# Patient Record
Sex: Female | Born: 1980 | Race: Black or African American | Hispanic: No | Smoking: Never smoker
Health system: Southern US, Community
[De-identification: ages and names within clinical notes are randomized; demographics above are authoritative.]

## PROBLEM LIST (undated history)

## (undated) DIAGNOSIS — R739 Hyperglycemia, unspecified: Secondary | ICD-10-CM

## (undated) DIAGNOSIS — R7303 Prediabetes: Secondary | ICD-10-CM

## (undated) DIAGNOSIS — Z8619 Personal history of other infectious and parasitic diseases: Secondary | ICD-10-CM

## (undated) DIAGNOSIS — K625 Hemorrhage of anus and rectum: Secondary | ICD-10-CM

## (undated) DIAGNOSIS — N393 Stress incontinence (female) (male): Secondary | ICD-10-CM

## (undated) DIAGNOSIS — K219 Gastro-esophageal reflux disease without esophagitis: Secondary | ICD-10-CM

## (undated) DIAGNOSIS — E041 Nontoxic single thyroid nodule: Secondary | ICD-10-CM

## (undated) DIAGNOSIS — R87619 Unspecified abnormal cytological findings in specimens from cervix uteri: Secondary | ICD-10-CM

## (undated) HISTORY — DX: Hyperglycemia, unspecified: R73.9

## (undated) HISTORY — PX: COLPOSCOPY: SHX161

## (undated) HISTORY — DX: Personal history of other infectious and parasitic diseases: Z86.19

## (undated) HISTORY — DX: Stress incontinence (female) (male): N39.3

## (undated) HISTORY — PX: UMBILICAL HERNIA REPAIR: SHX196

## (undated) HISTORY — PX: APPENDECTOMY: SHX54

## (undated) HISTORY — PX: BREAST IMPLANT REMOVAL: SUR1101

## (undated) HISTORY — PX: BREAST ENHANCEMENT SURGERY: SHX7

## (undated) HISTORY — DX: Hemorrhage of anus and rectum: K62.5

## (undated) HISTORY — DX: Nontoxic single thyroid nodule: E04.1

## (undated) HISTORY — DX: Prediabetes: R73.03

---
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
## 2008-02-28 DIAGNOSIS — R87619 Unspecified abnormal cytological findings in specimens from cervix uteri: Secondary | ICD-10-CM

## 2008-02-28 HISTORY — DX: Unspecified abnormal cytological findings in specimens from cervix uteri: R87.619

## 2011-02-24 ENCOUNTER — Encounter (HOSPITAL_COMMUNITY): Payer: Self-pay | Admitting: Obstetrics and Gynecology

## 2011-02-24 ENCOUNTER — Inpatient Hospital Stay (HOSPITAL_COMMUNITY)
Admission: AD | Admit: 2011-02-24 | Discharge: 2011-02-24 | Disposition: A | Discharge status: Home / Self Care | Payer: PRIVATE HEALTH INSURANCE | Source: Ambulatory Visit | Attending: Obstetrics & Gynecology | Admitting: Obstetrics & Gynecology

## 2011-02-24 ENCOUNTER — Inpatient Hospital Stay (HOSPITAL_COMMUNITY): Payer: PRIVATE HEALTH INSURANCE

## 2011-02-24 DIAGNOSIS — R188 Other ascites: Secondary | ICD-10-CM

## 2011-02-24 HISTORY — DX: Gastro-esophageal reflux disease without esophagitis: K21.9

## 2011-02-24 NOTE — Progress Notes (Signed)
Pt states, " I had an abortion by Dr Arlyce Dice today 6 pm and he sent me here for an [place] as he said he saw "free fluid" before the procedure and again after on his [place]. I am having some cramping too."

## 2011-02-24 NOTE — Progress Notes (Signed)
"  I found out I was pregnant last week.  I had an abortion today at [facility] with Dr. Arlyce Dice at 6:00 p.m.  He did an U/S before the procedure and saw free fluid.  He did another U/S after the procedure and still saw the free fluid.  He told me to come here to have an ultrasound."

## 2013-12-23 ENCOUNTER — Ambulatory Visit: Payer: PRIVATE HEALTH INSURANCE | Admitting: Family

## 2013-12-29 ENCOUNTER — Encounter (HOSPITAL_COMMUNITY): Payer: Self-pay | Admitting: Obstetrics and Gynecology

## 2014-01-05 ENCOUNTER — Ambulatory Visit: Payer: PRIVATE HEALTH INSURANCE | Admitting: Family

## 2014-01-08 ENCOUNTER — Telehealth: Payer: Self-pay | Admitting: *Deleted

## 2014-01-08 NOTE — Telephone Encounter (Signed)
Medical records received via fax from [facility]. [facility] JG//CMA

## 2014-01-09 ENCOUNTER — Encounter: Payer: Self-pay | Admitting: Family

## 2014-01-09 DIAGNOSIS — R7303 Prediabetes: Secondary | ICD-10-CM | POA: Insufficient documentation

## 2014-01-09 DIAGNOSIS — E119 Type 2 diabetes mellitus without complications: Secondary | ICD-10-CM | POA: Insufficient documentation

## 2014-01-09 DIAGNOSIS — R739 Hyperglycemia, unspecified: Secondary | ICD-10-CM | POA: Insufficient documentation

## 2014-01-09 DIAGNOSIS — R319 Hematuria, unspecified: Secondary | ICD-10-CM | POA: Insufficient documentation

## 2014-01-09 HISTORY — DX: Type 2 diabetes mellitus without complications: E11.9

## 2014-01-16 ENCOUNTER — Encounter: Payer: Self-pay | Admitting: Family

## 2014-01-16 ENCOUNTER — Ambulatory Visit (INDEPENDENT_AMBULATORY_CARE_PROVIDER_SITE_OTHER): Payer: PRIVATE HEALTH INSURANCE | Admitting: Family

## 2014-01-16 VITALS — BP 107/67 | HR 80 | Temp 98.1°F | Resp 16 | Ht 63.5 in | Wt 169.8 lb

## 2014-01-16 DIAGNOSIS — G250 Essential tremor: Secondary | ICD-10-CM | POA: Insufficient documentation

## 2014-01-16 DIAGNOSIS — R739 Hyperglycemia, unspecified: Secondary | ICD-10-CM

## 2014-01-16 NOTE — Progress Notes (Signed)
Pre visit review using our clinic review tool, if applicable. No additional management support is needed unless otherwise documented below in the visit note. 

## 2014-01-16 NOTE — Patient Instructions (Signed)
Please schedule fasting physical at the front desk in 3 months.  Continue to work on healthy low carb diet, exercise and weight loss. Welcome to [facility]!

## 2014-01-16 NOTE — Assessment & Plan Note (Signed)
Symptoms most consistent with benign head tremor. We did discuss referral to neurology, but she declines at this time.  Will consider if symptoms worsen.

## 2014-01-16 NOTE — Progress Notes (Addendum)
Subjective:    Patient ID: Sara Finley, female    DOB: 1980-07-02, 33 y.o.   MRN: [id number]  HPI  [name] is a 33 yr old female who presents today to establish care. Reports A1C in July was 6.0.  Prior to that it was 5.8.   Gave birth in April, now back to work.  Juggling parenting/work responsibilities and notes that she has not been adhering to diet or exercise as she should.   Another concern she has today is a mild head tremor. Reports that the tremor is worse when she is stressed and when she is visually trying to focus on something. Symptoms are mild and intermittent, reports sister has essential tremor.  Denies hand tremor.   Review of Systems  Constitutional:       Wt Readings from Last 3 Encounters: 01/16/14 : 169 lb 12.8 oz (77.021 kg) 135-140 before giving birth. She is breastfeeding  HENT: Positive for postnasal drip. Negative for hearing loss.   Eyes: Negative for visual disturbance.  Respiratory: Negative for cough, shortness of breath and stridor.   Cardiovascular: Negative for chest pain.  Gastrointestinal: Positive for constipation. Negative for diarrhea and blood in stool.       Uses colace prn  Genitourinary: Positive for urgency and frequency.  Neurological:       Reports some sinus headaches  Psychiatric/Behavioral:       Can get moody if she is overwhelmed  doesn't feel that she needs any meds for moods, manages ok on her own without meds.      Past Medical History  Diagnosis Date  . GERD (gastroesophageal reflux disease)   . Hyperglycemia   . Stress incontinence     History   Social History  . Marital Status: Married    Spouse Name: N/A    Number of Children: N/A  . Years of Education: N/A   Occupational History  . Not on file.   Social History Main Topics  . Smoking status: Never Smoker   . Smokeless tobacco: Not on file  . Alcohol Use: Yes     Comment: "sparingly.  maybe once per month"  . Drug Use: No  . Sexual Activity: Not  Currently    Birth Control/ Protection: Spermicide     Comment: "Not for the last month":   Other Topics Concern  . 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 Appendectomy    . Umbilical hernia repair  @ age 89  . Breast enhancement surgery      Family History  Problem Relation Age of Onset  . Hypertension Mother   . Hyperlipidemia Mother   . Hypertension Paternal Uncle   . Heart disease Paternal Uncle 50    MI/CABG x 4- died after failed bipass  . Hypertension Paternal Grandmother   . Stroke Paternal Grandmother   . Hypertension Father   . Hyperlipidemia Father   . Diabetes Father     ?pre-diabetes  . Benign prostatic hyperplasia Father   . Sleep apnea Father   . Asthma Father   . Cataracts Father   . Sleep apnea Brother  Allergies  Allergen Reactions  . Asa [Aspirin] Other (See Comments)    gastriitis    Current Outpatient Prescriptions on File Prior to Visit  Medication Sig Dispense Refill  . Multiple Vitamin (MULITIVITAMIN WITH MINERALS) TABS Take 1 tablet by mouth daily.       No current facility-administered medications on file prior to visit.    BP 107/67 mmHg  Pulse 80  Temp(Src) 98.1 F (36.7 C) (Oral)  Resp 16  Ht 5' 3.5" (1.613 m)  Wt 169 lb 12.8 oz (77.021 kg)  BMI 29.60 kg/m2  SpO2 100%  LMP 07/28/2013    Objective:   Physical Exam  Constitutional: She is oriented to person, place, and time. She appears well-developed and well-nourished. No distress.  HENT:  Head: Normocephalic and atraumatic.  Neck: No thyromegaly present.  Cardiovascular: Normal rate and regular rhythm.   No murmur heard. Pulmonary/Chest: Effort normal and breath sounds normal.  No respiratory distress. She has no wheezes. She has no rales. She exhibits no tenderness.  Musculoskeletal: She exhibits no edema.  Lymphadenopathy:    She has no cervical adenopathy.  Neurological: She is alert and oriented to person, place, and time.  Mild head tremor was noted briefly during exam today  Psychiatric: She has a normal mood and affect. Her behavior is normal. Judgment and thought content normal.          Assessment & Plan:

## 2014-01-16 NOTE — Assessment & Plan Note (Signed)
She declines A1C today. Wants to work on diet and lifestyle modification first.   25 minutes spent with pt today. >50% of this time was spent counseling pt on healthy diet, exercise, weight loss.

## 2014-02-27 LAB — HM PAP SMEAR
HM PAP: NORMAL
HM PAP: NORMAL

## 2014-04-20 ENCOUNTER — Encounter: Payer: PRIVATE HEALTH INSURANCE | Admitting: Family

## 2014-04-21 ENCOUNTER — Encounter: Payer: PRIVATE HEALTH INSURANCE | Admitting: Family

## 2014-04-29 ENCOUNTER — Telehealth: Payer: Self-pay

## 2014-04-29 NOTE — Telephone Encounter (Signed)
Medication: Review, verify sig & reconcile(including outside meds):  yes Duplicates discarded:  n/a DM supply source: n/a  Preferred Pharmacy and which med where: [facility] - [place], [place] - [address] 90 day supply/mail order: n/a Local pharmacy: [facility] - [place], [place] - [address]   Allergies verified: yes  Immunization Status: Prompted for insurance verification: n/a Flu vaccine--10/27/13 Tdap--12/2013- pt received when she was pregnant -  A/P:   Changes to FH, PSH or Personal Hx: Reviewed.  No changes. Pap-- 02/2014-normal;  [facility]   Care Teams Updated: ED/Hospital/Urgent Care Visits: NO Prompted for: Updated insurance, contact information, forms: yes Remind to bring: DPR information, advance directives: yes  To Discuss with Provider: Nothing at this time.

## 2014-05-01 ENCOUNTER — Ambulatory Visit (INDEPENDENT_AMBULATORY_CARE_PROVIDER_SITE_OTHER): Payer: PRIVATE HEALTH INSURANCE | Admitting: Family

## 2014-05-01 ENCOUNTER — Encounter: Payer: Self-pay | Admitting: Family

## 2014-05-01 VITALS — BP 110/62 | HR 75 | Temp 98.1°F | Resp 16 | Ht 63.5 in | Wt 171.6 lb

## 2014-05-01 DIAGNOSIS — K219 Gastro-esophageal reflux disease without esophagitis: Secondary | ICD-10-CM

## 2014-05-01 DIAGNOSIS — Z0000 Encounter for general adult medical examination without abnormal findings: Secondary | ICD-10-CM | POA: Insufficient documentation

## 2014-05-01 DIAGNOSIS — R739 Hyperglycemia, unspecified: Secondary | ICD-10-CM

## 2014-05-01 DIAGNOSIS — J309 Allergic rhinitis, unspecified: Secondary | ICD-10-CM | POA: Insufficient documentation

## 2014-05-01 LAB — HEPATIC FUNCTION PANEL
ALT: 11 U/L (ref 0–35)
AST: 16 U/L (ref 0–37)
Albumin: 3.9 g/dL (ref 3.5–5.2)
Alkaline Phosphatase: 80 U/L (ref 39–117)
Bilirubin, Direct: 0.1 mg/dL (ref 0.0–0.3)
Total Bilirubin: 0.5 mg/dL (ref 0.2–1.2)
Total Protein: 7.4 g/dL (ref 6.0–8.3)

## 2014-05-01 LAB — HEMOGLOBIN A1C: HEMOGLOBIN A1C: 6.2 % (ref 4.6–6.5)

## 2014-05-01 MED ORDER — FLUTICASONE PROPIONATE 50 MCG/ACT NA SUSP: 2.0000 | Freq: Every day | NASAL | Status: DC

## 2014-05-01 MED ORDER — CETIRIZINE HCL 10 MG PO TABS: 10.0000 mg | ORAL_TABLET | Freq: Every day | ORAL | Status: DC

## 2014-05-01 MED ORDER — OMEPRAZOLE 20 MG PO CPDR: 20.0000 mg | DELAYED_RELEASE_CAPSULE | Freq: Every day | ORAL | Status: DC

## 2014-05-01 NOTE — Assessment & Plan Note (Signed)
Uncontrolled.  D/c pepcid, start omeprazole.

## 2014-05-01 NOTE — Patient Instructions (Signed)
Please complete lab work prior to leaving. Stop pepcid, start prilosec. Start flonase, advise that you wait to start zyrtec until you have discontinued breast feeding. Follow up in 6 months.

## 2014-05-01 NOTE — Assessment & Plan Note (Signed)
Continue healthy diet, exercise.  Immunizations reviewed and up to date.

## 2014-05-01 NOTE — Progress Notes (Signed)
Subjective:    Patient ID: Sara Finley, female    DOB: 12/23/1980, 34 y.o.   MRN: [id number]  HPI  Patient presents today for complete physical.  Immunizations: up to date Diet: has reduced bread increased veggies Exercise: treadmill, weights Pap Smear:  Up to date per GYN- normal per pt  gerd- sxs 2-3 days a week.  She reports that she would like to change to omeprazole. She reports bloating, gasiness.    Review of Systems  Constitutional: Negative for fever, fatigue and unexpected weight change.  HENT: Positive for rhinorrhea and tinnitus.   Eyes: Negative for visual disturbance.  Respiratory: Negative for cough.   Cardiovascular: Negative for chest pain.       Rare palpitations associated only with anxiety  Gastrointestinal: Positive for constipation. Negative for diarrhea.       Reports mild nausea with gerd sxs only  Genitourinary: Negative for dysuria, frequency and menstrual problem.  Musculoskeletal: Negative for myalgias and arthralgias.  Skin:       Mild eczema  Neurological: Positive for dizziness.       Rare headaches. Attributes dizziness to sinus congestion  Hematological: Negative for adenopathy.  Psychiatric/Behavioral:       Reports mild depression in November. Has improved with exercise and improving her diet  Anxiety- work/home stress, coping well   Past Medical History  Diagnosis Date  . GERD (gastroesophageal reflux disease)   . Hyperglycemia   . Stress incontinence     History   Social History  . Marital Status: Married    Spouse Name: N/A  . Number of Children: N/A  . Years of Education: N/A   Occupational History  . Not on file.   Social History Main Topics  . Smoking status: Never Smoker   . Smokeless tobacco: Not on file  . Alcohol Use: 0.0 oz/week    0 Standard drinks or equivalent per week     Comment: "sparingly"  . Drug Use: No  . Sexual Activity: Not Currently    Birth Control/ Protection: Spermicide     Comment: "Not  for the last month":   Other Topics Concern  . 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 Appendectomy    . Umbilical hernia repair  @ age 36  . Breast enhancement surgery      Family History  Problem Relation Age of Onset  . Hypertension Mother   . Hyperlipidemia Mother   . Hypertension Paternal Uncle   . Heart disease Paternal Uncle 50    MI/CABG x 4- died after failed bipass  . Hypertension Paternal Grandmother   . Stroke Paternal Grandmother   . Hypertension Father   . Hyperlipidemia Father   . Diabetes Father     ?pre-diabetes  . Benign prostatic hyperplasia Father   . Sleep apnea Father   . Asthma Father   . Cataracts Father   . Sleep apnea Brother     Allergies  Allergen Reactions  . Asa [Aspirin] Other (See Comments)    gastriitis    Current Outpatient Prescriptions on File Prior to Visit  Medication Sig Dispense Refill  .  cetirizine (ZYRTEC) 10 MG tablet Take 10 mg by mouth daily as needed for allergies.    Marland Kitchen. docusate sodium (COLACE) 100 MG capsule Take 100 mg by mouth as needed for mild constipation.    . Multiple Vitamin (MULITIVITAMIN WITH MINERALS) TABS Take 1 tablet by mouth daily.       No current facility-administered medications on file prior to visit.    BP 110/62 mmHg  Pulse 75  Temp(Src) 98.1 F (36.7 C) (Oral)  Resp 16  Ht 5' 3.5" (1.613 m)  Wt 171 lb 9.6 oz (77.837 kg)  BMI 29.92 kg/m2  SpO2 99%       Objective:   Physical Exam  Physical Exam  Constitutional: She is oriented to person, place, and time. She appears well-developed and well-nourished. No distress.  HENT:  Head: Normocephalic and atraumatic.  Right Ear: Tympanic membrane and ear  canal normal.  Left Ear: Tympanic membrane and ear canal normal.  Mouth/Throat: Oropharynx is clear and moist.  Eyes: Pupils are equal, round, and reactive to light. No scleral icterus.  Neck: Normal range of motion. No thyromegaly present.  Cardiovascular: Normal rate and regular rhythm.   No murmur heard. Pulmonary/Chest: Effort normal and breath sounds normal. No respiratory distress. He has no wheezes. She has no rales. She exhibits no tenderness.  Abdominal: Soft. Bowel sounds are normal. He exhibits no distension and no mass. There is no tenderness. There is no rebound and no guarding.  Musculoskeletal: She exhibits no edema.  Lymphadenopathy:    She has no cervical adenopathy.  Neurological: She is alert and oriented to person, place, and time. She has normal reflexes. She exhibits normal muscle tone. Coordination normal.  Skin: Skin is warm and dry.  Psychiatric: She has a normal mood and affect. Her behavior is normal. Judgment and thought content normal.  Breast/pelvic: deferred to GYN         Assessment & Plan:         Assessment & Plan:

## 2014-05-01 NOTE — Addendum Note (Signed)
Addended by: Sandford Craze'SULLIVAN, Merial Moritz on: 05/01/2014 09:20 AM   Modules accepted: Orders, SmartSet

## 2014-05-01 NOTE — Progress Notes (Signed)
Pre visit review using our clinic review tool, if applicable. No additional management support is needed unless otherwise documented below in the visit note. 

## 2014-05-01 NOTE — Assessment & Plan Note (Signed)
We discussed adding zyrtec (after baby is weaned) and flonase. Refills sent to pharmacy.

## 2014-05-01 NOTE — Assessment & Plan Note (Signed)
Obtain a1c.  

## 2014-11-04 ENCOUNTER — Ambulatory Visit (INDEPENDENT_AMBULATORY_CARE_PROVIDER_SITE_OTHER): Payer: PRIVATE HEALTH INSURANCE | Admitting: Family

## 2014-11-04 ENCOUNTER — Encounter: Payer: Self-pay | Admitting: Family

## 2014-11-04 VITALS — BP 104/74 | HR 70 | Temp 98.0°F | Resp 16 | Wt 175.6 lb

## 2014-11-04 DIAGNOSIS — K219 Gastro-esophageal reflux disease without esophagitis: Secondary | ICD-10-CM

## 2014-11-04 DIAGNOSIS — R739 Hyperglycemia, unspecified: Secondary | ICD-10-CM | POA: Diagnosis not present

## 2014-11-04 DIAGNOSIS — E663 Overweight: Secondary | ICD-10-CM

## 2014-11-04 LAB — POCT URINALYSIS DIPSTICK
Bilirubin, UA: NEGATIVE
Blood, UA: NEGATIVE
Glucose, UA: NEGATIVE
Ketones, UA: NEGATIVE
LEUKOCYTES UA: NEGATIVE
NITRITE UA: NEGATIVE
PROTEIN UA: NEGATIVE
Spec Grav, UA: 1.01
UROBILINOGEN UA: NEGATIVE
pH, UA: 6.5

## 2014-11-04 LAB — HEMOGLOBIN A1C: Hgb A1c MFr Bld: 6.1 % (ref 4.6–6.5)

## 2014-11-04 MED ORDER — LORCASERIN HCL 10 MG PO TABS: 1.0000 | ORAL_TABLET | Freq: Every day | ORAL | Status: DC

## 2014-11-04 NOTE — Progress Notes (Signed)
Subjective:    Patient ID: Sara Finley, female    DOB: 13-May-1980, 34 y.o.   MRN: [id number]  HPI   Sara Finley is a 34 yr old female who presents today for follow up.  1) GERD-  Here for 6 month follow up. Maintained on prilosec as needed.    2) Hyperglycemia-  Notes urinary frequency and polyuria x 1 year.  3) R ear- popping, ringing, burning sensation.  4) Obesity- Pt's BMI is 30.  She reports that she exercises 4 days a week 1 hour on treadmill on elliptical/1 hour weights. No weight loss. Has cut down on quantity of her food.  Cooks home meals.  Not counting calories.  Wt Readings from Last 3 Encounters:  11/04/14 175 lb 9.6 oz (79.652 kg)  05/01/14 171 lb 9.6 oz (77.837 kg)  01/16/14 169 lb 12.8 oz (77.021 kg)    Review of Systems See HPI Past Medical History  Diagnosis Date  . GERD (gastroesophageal reflux disease)   . Hyperglycemia   . Stress incontinence     Social History   Social History  . Marital Status: Married    Spouse Name: N/A  . Number of Children: N/A  . Years of Education: N/A   Occupational History  . Not on file.   Social History Main Topics  . Smoking status: Never Smoker   . Smokeless tobacco: Not on file  . Alcohol Use: 0.0 oz/week    0 Standard drinks or equivalent per week     Comment: "sparingly"  . Drug Use: No  . Sexual Activity: Not Currently    Birth Control/ Protection: Spermicide     Comment: "Not for the last month":   Other Topics Concern  . 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 Appendectomy    . Umbilical hernia repair  @ age 17  . Breast enhancement surgery      Family History  Problem Relation Age of Onset  .  Hypertension Mother   . Hyperlipidemia Mother   . Hypertension Paternal Uncle   . Heart disease Paternal Uncle 50    MI/CABG x 4- died after failed bipass  . Hypertension Paternal Grandmother   . Stroke Paternal Grandmother   . Hypertension Father   . Hyperlipidemia Father   . Diabetes Father     ?pre-diabetes  . Benign prostatic hyperplasia Father   . Sleep apnea Father   . Asthma Father   . Cataracts Father   . Sleep apnea Brother     Allergies  Allergen Reactions  . Asa [Aspirin] Other (See Comments)    gastriitis    Current Outpatient Prescriptions on File Prior to Visit  Medication Sig Dispense Refill  . cetirizine (ZYRTEC) 10 MG tablet Take 10 mg by mouth daily as needed for allergies.    Marland Kitchen docusate sodium (COLACE) 100 MG capsule Take 100 mg by mouth as needed for mild constipation.    . fluticasone (FLONASE) 50 MCG/ACT nasal spray Place 2 sprays into both nostrils daily. 16 g 5  . Multiple Vitamin (MULITIVITAMIN WITH MINERALS) TABS Take 1 tablet by mouth daily.      Marland Kitchen  omeprazole (PRILOSEC) 20 MG capsule Take 1 capsule (20 mg total) by mouth daily. 30 capsule 5   No current facility-administered medications on file prior to visit.    BP 104/74 mmHg  Pulse 70  Temp(Src) 98 F (36.7 C) (Oral)  Resp 16  Wt 175 lb 9.6 oz (79.652 kg)  SpO2 100%  LMP 09/28/2014       Objective:   Physical Exam  Constitutional: She is oriented to person, place, and time. She appears well-developed and well-nourished.  HENT:  Head: Normocephalic and atraumatic.  Right Ear: Tympanic membrane and ear canal normal.  Left Ear: Tympanic membrane and ear canal normal.  Cardiovascular: Normal rate, regular rhythm and normal heart sounds.   No murmur heard. Pulmonary/Chest: Effort normal and breath sounds normal. No respiratory distress. She has no wheezes.  Musculoskeletal: She exhibits no edema.  Neurological: She is alert and oriented to person, place, and time.  Psychiatric: She  has a normal mood and affect. Her behavior is normal. Judgment and thought content normal.          Assessment & Plan:

## 2014-11-04 NOTE — Patient Instructions (Signed)
Stop omeprazole. Please start pepcid otc once daily. Continue zyrtec, add sudafed daily x 1 week. Start belviq. Work on counting calories using my fitness pal.  Goal weight loss 1 pound per week.

## 2014-11-04 NOTE — Progress Notes (Signed)
Pre visit review using our clinic review tool, if applicable. No additional management support is needed unless otherwise documented below in the visit note. 

## 2014-11-05 ENCOUNTER — Encounter: Payer: Self-pay | Admitting: Family

## 2014-11-07 DIAGNOSIS — Z6829 Body mass index (BMI) 29.0-29.9, adult: Secondary | ICD-10-CM | POA: Insufficient documentation

## 2014-11-07 DIAGNOSIS — E669 Obesity, unspecified: Secondary | ICD-10-CM | POA: Insufficient documentation

## 2014-11-07 DIAGNOSIS — E663 Overweight: Secondary | ICD-10-CM | POA: Insufficient documentation

## 2014-11-07 NOTE — Assessment & Plan Note (Signed)
Stable with prn use of PPI.

## 2014-11-07 NOTE — Assessment & Plan Note (Signed)
Lab Results  Component Value Date   HGBA1C 6.1 11/04/2014   Obtain follow up A1C.

## 2014-11-07 NOTE — Assessment & Plan Note (Signed)
Discussed diet, exercise, weight loss, trial of belviq.

## 2014-11-17 ENCOUNTER — Telehealth: Payer: Self-pay | Admitting: Family

## 2014-11-17 NOTE — Telephone Encounter (Signed)
Notified pt per 11/05/14 lab letter and she voices understanding.  She states Belviq caused her to have dizziness and nausea so she stopped it this weekend. Pt cancelled 1 month f/u on 12/02/14 and wants to know when she should follow up in the future? Please advise.

## 2014-11-17 NOTE — Telephone Encounter (Signed)
Left message for pt to return my call.

## 2014-11-17 NOTE — Telephone Encounter (Signed)
Caller name: Quenisha Lovins Relationship to patient: Self  Can be reached: [phone] Pharmacy:  Reason for call: pt called in requesting lab results.

## 2014-11-17 NOTE — Telephone Encounter (Signed)
Patient returning your call.

## 2014-11-18 NOTE — Telephone Encounter (Signed)
Notified pt and scheduled f/u for 05/11/15 at 9:30am.

## 2014-11-18 NOTE — Telephone Encounter (Signed)
6 months please 

## 2014-12-02 ENCOUNTER — Ambulatory Visit: Payer: PRIVATE HEALTH INSURANCE | Admitting: Family

## 2015-02-24 DIAGNOSIS — E221 Hyperprolactinemia: Secondary | ICD-10-CM | POA: Insufficient documentation

## 2015-05-11 ENCOUNTER — Telehealth: Payer: Self-pay | Admitting: Family

## 2015-05-11 ENCOUNTER — Ambulatory Visit: Payer: PRIVATE HEALTH INSURANCE | Admitting: Family

## 2015-05-12 ENCOUNTER — Encounter: Payer: Self-pay | Admitting: Family

## 2015-05-12 NOTE — Telephone Encounter (Signed)
Marked to charge and mailing no show letter °

## 2015-05-12 NOTE — Telephone Encounter (Signed)
Yes please

## 2015-05-12 NOTE — Telephone Encounter (Signed)
Pt was no show 05/11/15 9:30am for follow up appt, pt has not rescheduled, 1st no show I see, charge or no charge?

## 2015-09-29 DIAGNOSIS — E559 Vitamin D deficiency, unspecified: Secondary | ICD-10-CM | POA: Insufficient documentation

## 2015-09-29 DIAGNOSIS — L680 Hirsutism: Secondary | ICD-10-CM | POA: Insufficient documentation

## 2015-11-24 ENCOUNTER — Encounter: Payer: Self-pay | Admitting: Family

## 2015-11-24 ENCOUNTER — Ambulatory Visit (INDEPENDENT_AMBULATORY_CARE_PROVIDER_SITE_OTHER): Payer: 59 | Admitting: Family

## 2015-11-24 VITALS — BP 106/72 | HR 65 | Temp 98.4°F | Resp 16 | Ht 63.0 in | Wt 173.4 lb

## 2015-11-24 DIAGNOSIS — M545 Low back pain: Secondary | ICD-10-CM

## 2015-11-24 DIAGNOSIS — R739 Hyperglycemia, unspecified: Secondary | ICD-10-CM | POA: Diagnosis not present

## 2015-11-24 LAB — POCT URINALYSIS DIPSTICK
Bilirubin, UA: NEGATIVE
Glucose, UA: NEGATIVE
Ketones, UA: NEGATIVE
LEUKOCYTES UA: NEGATIVE
NITRITE UA: NEGATIVE
PROTEIN UA: NEGATIVE
SPEC GRAV UA: 1.01
UROBILINOGEN UA: NEGATIVE
pH, UA: 7.5

## 2015-11-24 LAB — HEMOGLOBIN A1C: HEMOGLOBIN A1C: 6 % (ref 4.6–6.5)

## 2015-11-24 NOTE — Progress Notes (Signed)
Pre visit review using our clinic review tool, if applicable. No additional management support is needed unless otherwise documented below in the visit note. 

## 2015-11-24 NOTE — Assessment & Plan Note (Signed)
Improved per patient report.  Encouraged her to continue healthy diet, work on exercise. Will obtain follow up A1C.

## 2015-11-24 NOTE — Patient Instructions (Signed)
Please complete lab work prior to leaving. Continue healthy diet and try to add some regular exercise such as walking. For low back pain you may want to try adding ibuprofen for a few days.  Let us know if symptoms worsen or if symptoms do not improve.

## 2015-11-24 NOTE — Progress Notes (Signed)
Subjective:    Patient ID: Sara Finley, female    DOB: 03-18-1980, 35 y.o.   MRN: [id number]  HPI  Ms. Well is a 35 yr old female who presents today for follow up.  1) Hyperglycemia- has been checking sugars at home.  115 post prandial. Fasting 70-80's. Never over 120. She has improved her diet. More veggies and less carbs.  Not exercising regularly.  Lab Results  Component Value Date   HGBA1C 6.1 11/04/2014   2) Low back pain- has a nagging low back pain, comes and goes, non-radiating. Is across her lower back.  She has tried tylenol with some improvement.     Review of Systems See HPI  Past Medical History:  Diagnosis Date  . GERD (gastroesophageal reflux disease)   . Hyperglycemia   . Stress incontinence      Social History   Social History  . Marital status: Married    Spouse name: N/A  . Number of children: N/A  . Years of education: N/A   Occupational History  . Not on file.   Social History Main Topics  . Smoking status: Never Smoker  . Smokeless tobacco: Not on file  . Alcohol use 0.0 oz/week     Comment: "sparingly"  . Drug use: No  . Sexual activity: Not Currently    Birth control/ protection: Spermicide     Comment: "Not for the last month":   Other Topics Concern  . 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 APPENDECTOMY    . BREAST ENHANCEMENT SURGERY    . UMBILICAL HERNIA REPAIR  @ age 35    Family History  Problem Relation Age of Onset  . Hypertension Mother   . Hyperlipidemia Mother   . Hypertension Paternal Uncle   . Heart disease Paternal Uncle 50    MI/CABG x 4- died after failed bipass  . Hypertension Paternal Grandmother   . Stroke  Paternal Grandmother   . Hypertension Father   . Hyperlipidemia Father   . Diabetes Father     ?pre-diabetes  . Benign prostatic hyperplasia Father   . Sleep apnea Father   . Asthma Father   . Cataracts Father   . Sleep apnea Brother     Allergies  Allergen Reactions  . Asa [Aspirin] Other (See Comments)    gastriitis  . Belviq [Lorcaserin Hcl] Other (See Comments)    Dizziness, nausea    Current Outpatient Prescriptions on File Prior to Visit  Medication Sig Dispense Refill  . cetirizine (ZYRTEC) 10 MG tablet Take 10 mg by mouth daily as needed for allergies.    Marland Kitchen. docusate sodium (COLACE) 100 MG capsule Take 100 mg by mouth as needed for mild constipation.    . fluticasone (FLONASE) 50 MCG/ACT nasal spray Place 2 sprays into both nostrils daily. (Patient taking differently: Place 2 sprays into both nostrils daily as needed. ) 16 g 5  . omeprazole (PRILOSEC) 20 MG capsule Take 1 capsule (20 mg total) by mouth daily. 30 capsule 5   No current facility-administered medications on file prior to visit.     BP 106/72 (  BP Location: Right Arm, Cuff Size: Normal)   Pulse 65   Temp 98.4 F (36.9 C) (Oral)   Resp 16   Ht 5\' 3"  (1.6 m)   Wt 173 lb 6.4 oz (78.7 kg)   LMP 11/24/2015   SpO2 100% Comment: room air  BMI 30.72 kg/m       Objective:   Physical Exam  Constitutional: She is oriented to person, place, and time. She appears well-developed and well-nourished.  HENT:  Head: Normocephalic and atraumatic.  Cardiovascular: Normal rate, regular rhythm and normal heart sounds.   No murmur heard. Pulmonary/Chest: Effort normal and breath sounds normal. No respiratory distress. She has no wheezes.  Musculoskeletal: She exhibits no edema.       Cervical back: She exhibits no tenderness.       Thoracic back: She exhibits no tenderness.       Lumbar back: She exhibits no tenderness.  Neurological: She is alert and oriented to person, place, and time.  Psychiatric: She has a  normal mood and affect. Her behavior is normal. Judgment and thought content normal.          Assessment & Plan:  Low back pain- likely musculoskeletal. UA positive for blood only- has menses.  Advised patient on short course of ibuprofen and advised to follow up if symptoms worsen or do not improve.

## 2015-11-25 LAB — URINE CULTURE

## 2015-11-30 ENCOUNTER — Telehealth: Payer: Self-pay | Admitting: *Deleted

## 2015-11-30 NOTE — Telephone Encounter (Signed)
-----   Message from Sandford CrazeMelissa O'Sullivan, NP sent at 11/26/2015  1:08 PM EDT ----- Urine culture just showed some contamination, no UTI.

## 2016-03-14 ENCOUNTER — Other Ambulatory Visit: Payer: Self-pay | Admitting: Family

## 2016-03-14 MED ORDER — AMOXICILLIN-POT CLAVULANATE 875-125 MG PO TABS: 1.0000 | ORAL_TABLET | Freq: Two times a day (BID) | ORAL | 0 refills | Status: DC

## 2016-03-14 MED FILL — AMOX TR-K CLV 875-125 MG TA: 875-125 | 10 days supply | Qty: 20 | Fill #0

## 2016-08-28 DIAGNOSIS — Z01419 Encounter for gynecological examination (general) (routine) without abnormal findings: Secondary | ICD-10-CM | POA: Diagnosis not present

## 2016-08-28 DIAGNOSIS — Z124 Encounter for screening for malignant neoplasm of cervix: Secondary | ICD-10-CM | POA: Diagnosis not present

## 2016-09-04 LAB — HM PAP SMEAR: HM PAP: NORMAL

## 2016-12-06 ENCOUNTER — Other Ambulatory Visit: Payer: Self-pay | Admitting: Family

## 2016-12-06 MED ORDER — CIPROFLOXACIN HCL 250 MG PO TABS: 250.0000 mg | ORAL_TABLET | Freq: Two times a day (BID) | ORAL | 0 refills | Status: DC

## 2016-12-06 MED FILL — CIPROFLOXACIN HCL 250 MG TA: 250 | 3 days supply | Qty: 6 | Fill #0

## 2017-01-10 ENCOUNTER — Encounter: Payer: Self-pay | Admitting: Family

## 2017-01-10 ENCOUNTER — Ambulatory Visit (INDEPENDENT_AMBULATORY_CARE_PROVIDER_SITE_OTHER): Payer: 59 | Admitting: Family

## 2017-01-10 VITALS — BP 102/70 | HR 71 | Temp 98.5°F | Resp 16 | Ht 63.0 in | Wt 183.2 lb

## 2017-01-10 DIAGNOSIS — Z0000 Encounter for general adult medical examination without abnormal findings: Secondary | ICD-10-CM | POA: Diagnosis not present

## 2017-01-10 DIAGNOSIS — Z23 Encounter for immunization: Secondary | ICD-10-CM

## 2017-01-10 DIAGNOSIS — R739 Hyperglycemia, unspecified: Secondary | ICD-10-CM

## 2017-01-10 LAB — CBC WITH DIFFERENTIAL/PLATELET
BASOS ABS: 0 10*3/uL (ref 0.0–0.1)
BASOS PCT: 0.6 % (ref 0.0–3.0)
EOS ABS: 0.1 10*3/uL (ref 0.0–0.7)
Eosinophils Relative: 1.3 % (ref 0.0–5.0)
HEMATOCRIT: 39.6 % (ref 36.0–46.0)
HEMOGLOBIN: 12.8 g/dL (ref 12.0–15.0)
LYMPHS PCT: 44.6 % (ref 12.0–46.0)
Lymphs Abs: 2.7 10*3/uL (ref 0.7–4.0)
MCHC: 32.4 g/dL (ref 30.0–36.0)
MCV: 85.9 fl (ref 78.0–100.0)
MONO ABS: 0.5 10*3/uL (ref 0.1–1.0)
Monocytes Relative: 7.8 % (ref 3.0–12.0)
Neutro Abs: 2.7 10*3/uL (ref 1.4–7.7)
Neutrophils Relative %: 45.7 % (ref 43.0–77.0)
Platelets: 299 10*3/uL (ref 150.0–400.0)
RBC: 4.61 Mil/uL (ref 3.87–5.11)
RDW: 13.1 % (ref 11.5–15.5)
WBC: 6 10*3/uL (ref 4.0–10.5)

## 2017-01-10 LAB — BASIC METABOLIC PANEL
BUN: 8 mg/dL (ref 6–23)
CALCIUM: 9.7 mg/dL (ref 8.4–10.5)
CHLORIDE: 101 meq/L (ref 96–112)
CO2: 30 mEq/L (ref 19–32)
CREATININE: 0.75 mg/dL (ref 0.40–1.20)
GFR: 111.93 mL/min (ref >60.00)
Glucose, Bld: 94 mg/dL (ref 70–99)
Potassium: 3.6 mEq/L (ref 3.5–5.1)
Sodium: 138 mEq/L (ref 135–145)

## 2017-01-10 LAB — LIPID PANEL
Cholesterol: 168 mg/dL (ref 0–200)
HDL: 58.7 mg/dL (ref >39.00)
LDL CALC: 100 mg/dL — AB (ref 0–99)
NONHDL: 109.22
TRIGLYCERIDES: 48 mg/dL (ref 0.0–149.0)
Total CHOL/HDL Ratio: 3
VLDL: 9.6 mg/dL (ref 0.0–40.0)

## 2017-01-10 LAB — HEPATIC FUNCTION PANEL
ALT: 14 U/L (ref 0–35)
AST: 24 U/L (ref 0–37)
Albumin: 4.4 g/dL (ref 3.5–5.2)
Alkaline Phosphatase: 60 U/L (ref 39–117)
BILIRUBIN DIRECT: 0.1 mg/dL (ref 0.0–0.3)
BILIRUBIN TOTAL: 0.5 mg/dL (ref 0.2–1.2)
TOTAL PROTEIN: 7.9 g/dL (ref 6.0–8.3)

## 2017-01-10 LAB — URINALYSIS, ROUTINE W REFLEX MICROSCOPIC
Bilirubin Urine: NEGATIVE
Ketones, ur: NEGATIVE
Leukocytes, UA: NEGATIVE
Nitrite: NEGATIVE
PH: 7 (ref 5.0–8.0)
Total Protein, Urine: NEGATIVE
URINE GLUCOSE: NEGATIVE
UROBILINOGEN UA: 0.2 (ref 0.0–1.0)
WBC UA: NONE SEEN (ref 0–?)

## 2017-01-10 LAB — TSH: TSH: 1.14 u[IU]/mL (ref 0.35–4.50)

## 2017-01-10 LAB — HEMOGLOBIN A1C: HEMOGLOBIN A1C: 6.1 % (ref 4.6–6.5)

## 2017-01-10 NOTE — Patient Instructions (Signed)
Continue your work on healthy diet, regular exercise and weight loss. Try to count calories using my fitness pal with a goal weight loss of 1-2 pounds/week.

## 2017-01-10 NOTE — Progress Notes (Signed)
Subjective:    Patient ID: Sara Finley, female    DOB: 20-Jan-1981, 36 y.o.   MRN: [id number]  HPI  Patient presents today for complete physical.  Immunizations: does not believe she had tetanus in 2015.   Diet: reducing carbs Has lost 5 pound Wt Readings from Last 3 Encounters:  01/10/17 183 lb 3.2 oz (83.1 kg)  11/24/15 173 lb 6.4 oz (78.7 kg)  11/04/14 175 lb 9.6 oz (79.7 kg)  Coffee/creamer 7:30 AM Protein shake/green smoothie (spinach/kale apples/pears) 9AM Exercise: 3-4 times a week, strengthening Pap Smear: 2018 ([facility]) Vision: last year Dental:  Up to date   Review of Systems  Constitutional: Negative for unexpected weight change.  HENT: Positive for tinnitus. Negative for hearing loss and rhinorrhea.   Eyes: Negative for visual disturbance.  Respiratory: Negative for cough.   Cardiovascular:       Some swelling after prolonged standing  Gastrointestinal: Negative for diarrhea.       Constipation- improved with current diet  Genitourinary: Negative for dysuria.       Some urinary frequency   Musculoskeletal: Negative for arthralgias and myalgias.  Skin: Negative for rash.  Neurological:       Occasional headaches  Hematological: Negative for adenopathy.   Past Medical History:  Diagnosis Date  . GERD (gastroesophageal reflux disease)   . Hyperglycemia   . Stress incontinence      Social History   Socioeconomic History  . Marital status: Married    Spouse name: Not on file  . Number of children: Not on file  . Years of education: Not on file  . Highest education level: Not on file  Social Needs  . Financial resource strain: Not on file  . Food insecurity - worry: Not on file  . Food insecurity - inability: Not on file  . Transportation needs - medical: Not on file  . Transportation needs - non-medical: Not on file  Occupational History  . Not on file  Tobacco Use  . Smoking status: Never Smoker  . Smokeless tobacco: Never Used    Substance and Sexual Activity  . Alcohol use: Yes    Alcohol/week: 0.0 oz    Comment: "sparingly"  . Drug use: No  . Sexual activity: Not Currently    Birth control/protection: Spermicide    Comment: "Not for the last month":  Other Topics Concern  . 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 APPENDECTOMY    . BREAST ENHANCEMENT SURGERY    . UMBILICAL HERNIA REPAIR  @ age 36    Family History  Problem Relation Age of Onset  . Hypertension Mother   . Hyperlipidemia Mother   . Hypertension Paternal Grandmother   . Stroke Paternal Grandmother   . Hypertension Father   . Hyperlipidemia Father   . Diabetes Father        ?pre-diabetes  . Benign prostatic hyperplasia Father   . Sleep apnea Father   . Asthma Father   . Cataracts Father   . Hypertension Paternal Uncle   . Heart disease Paternal Uncle 50       MI/CABG x 4- died after  failed bipass  . Sleep apnea Brother     Allergies  Allergen Reactions  . Asa [Aspirin] Other (See Comments)    gastriitis  . Belviq [Lorcaserin Hcl] Other (See Comments)    Dizziness, nausea    Current Outpatient Medications on File Prior to Visit  Medication Sig Dispense Refill  . cetirizine (ZYRTEC) 10 MG tablet Take 10 mg by mouth daily as needed for allergies.    Marland Kitchen. docusate sodium (COLACE) 100 MG capsule Take 100 mg by mouth as needed for mild constipation.    . fluticasone (FLONASE) 50 MCG/ACT nasal spray Place 2 sprays into both nostrils daily. (Patient taking differently: Place 2 sprays into both nostrils daily as needed. ) 16 g 5  . omeprazole (PRILOSEC) 20 MG capsule Take 1 capsule (20 mg total) by mouth daily. 30 capsule 5   No current  facility-administered medications on file prior to visit.     BP 102/70 (BP Location: Right Arm)   Pulse 71   Temp 98.5 F (36.9 C) (Oral)   Resp 16   Ht 5\' 3"  (1.6 m)   Wt 183 lb 3.2 oz (83.1 kg)   LMP 12/08/2016   SpO2 100%   BMI 32.45 kg/m       Objective:   Physical Exam Physical Exam  Constitutional: She is oriented to person, place, and time. She appears well-developed and well-nourished. No distress.  HENT:  Head: Normocephalic and atraumatic.  Right Ear: Tympanic membrane and ear canal normal.  Left Ear: Tympanic membrane and ear canal normal.  Mouth/Throat: Oropharynx is clear and moist.  Eyes: Pupils are equal, round, and reactive to light. No scleral icterus.  Neck: Normal range of motion. No thyromegaly present.  Cardiovascular: Normal rate and regular rhythm.   No murmur heard. Pulmonary/Chest: Effort normal and breath sounds normal. No respiratory distress. He has no wheezes. She has no rales. She exhibits no tenderness.  Abdominal: Soft. Bowel sounds are normal. She exhibits no distension and no mass. There is no tenderness. There is no rebound and no guarding.  Musculoskeletal: She exhibits no edema.  Lymphadenopathy:    She has no cervical adenopathy.  Neurological: She is alert and oriented to person, place, and time. She has normal patellar reflexes. She exhibits normal muscle tone. Coordination normal.  Skin: Skin is warm and dry.  Psychiatric: She has a normal mood and affect. Her behavior is normal. Judgment and thought content normal.  Breast/pelvic: deferred to GYN        Assessment & Plan:          Assessment & Plan:  Preventative care- Tdap today.  Obtain routine lab work including A1C.  Discussed healthy diet, exercise and weight loss.

## 2017-01-11 ENCOUNTER — Telehealth: Payer: Self-pay | Admitting: Family

## 2017-01-11 NOTE — Telephone Encounter (Signed)
See mychart.  

## 2017-07-11 MED FILL — ATOVAQUONE-PROGUANIL 250-10: 250-100 | 16 days supply | Qty: 16 | Fill #0

## 2017-08-23 ENCOUNTER — Ambulatory Visit (INDEPENDENT_AMBULATORY_CARE_PROVIDER_SITE_OTHER): Payer: 59 | Admitting: Family

## 2017-08-23 ENCOUNTER — Telehealth: Payer: Self-pay | Admitting: Family

## 2017-08-23 ENCOUNTER — Ambulatory Visit (HOSPITAL_BASED_OUTPATIENT_CLINIC_OR_DEPARTMENT_OTHER)
Admission: RE | Admit: 2017-08-23 | Discharge: 2017-08-23 | Disposition: A | Discharge status: Home / Self Care | Payer: 59 | Source: Ambulatory Visit | Attending: Family | Admitting: Family

## 2017-08-23 ENCOUNTER — Encounter: Payer: Self-pay | Admitting: Family

## 2017-08-23 VITALS — BP 100/66 | HR 80 | Temp 98.8°F | Resp 16 | Ht 63.0 in | Wt 177.4 lb

## 2017-08-23 DIAGNOSIS — R899 Unspecified abnormal finding in specimens from other organs, systems and tissues: Secondary | ICD-10-CM

## 2017-08-23 DIAGNOSIS — N760 Acute vaginitis: Secondary | ICD-10-CM

## 2017-08-23 DIAGNOSIS — E041 Nontoxic single thyroid nodule: Secondary | ICD-10-CM

## 2017-08-23 DIAGNOSIS — E049 Nontoxic goiter, unspecified: Secondary | ICD-10-CM | POA: Diagnosis not present

## 2017-08-23 DIAGNOSIS — R5383 Other fatigue: Secondary | ICD-10-CM | POA: Diagnosis not present

## 2017-08-23 DIAGNOSIS — R739 Hyperglycemia, unspecified: Secondary | ICD-10-CM | POA: Diagnosis not present

## 2017-08-23 LAB — T4, FREE: Free T4: 0.84 ng/dL (ref 0.60–1.60)

## 2017-08-23 LAB — CBC WITH DIFFERENTIAL/PLATELET
Basophils Absolute: 0 10*3/uL (ref 0.0–0.1)
Basophils Relative: 0.5 % (ref 0.0–3.0)
EOS PCT: 0.9 % (ref 0.0–5.0)
Eosinophils Absolute: 0 10*3/uL (ref 0.0–0.7)
HCT: 37.1 % (ref 36.0–46.0)
Hemoglobin: 12.4 g/dL (ref 12.0–15.0)
Lymphocytes Relative: 34.1 % (ref 12.0–46.0)
Lymphs Abs: 1.8 10*3/uL (ref 0.7–4.0)
MCHC: 33.3 g/dL (ref 30.0–36.0)
MCV: 84.5 fl (ref 78.0–100.0)
MONO ABS: 0.5 10*3/uL (ref 0.1–1.0)
Monocytes Relative: 8.8 % (ref 3.0–12.0)
NEUTROS PCT: 55.7 % (ref 43.0–77.0)
Neutro Abs: 3 10*3/uL (ref 1.4–7.7)
Platelets: 336 10*3/uL (ref 150.0–400.0)
RBC: 4.39 Mil/uL (ref 3.87–5.11)
RDW: 13.4 % (ref 11.5–15.5)
WBC: 5.3 10*3/uL (ref 4.0–10.5)

## 2017-08-23 LAB — T3, FREE: T3 FREE: 3.7 pg/mL (ref 2.3–4.2)

## 2017-08-23 LAB — HEMOGLOBIN A1C: HEMOGLOBIN A1C: 6.2 % (ref 4.6–6.5)

## 2017-08-23 LAB — TSH: TSH: 0.82 u[IU]/mL (ref 0.35–4.50)

## 2017-08-23 MED ORDER — FLUCONAZOLE 150 MG PO TABS: ORAL_TABLET | ORAL | 0 refills | Status: DC

## 2017-08-23 MED ORDER — METRONIDAZOLE 0.75 % VA GEL: 1.0000 | Freq: Every day | VAGINAL | 0 refills | Status: AC

## 2017-08-23 MED FILL — FLUCONAZOLE 150 MG TABS: 150 | 3 days supply | Qty: 2 | Fill #0

## 2017-08-23 NOTE — Telephone Encounter (Signed)
Pt given results per notes of Sara CrazeMelissa O'Sullivan on 08/23/17. Unable to document in result note due to [facility]    Please let pt know that thyroid ultrasound shows 2 nodules. One of the nodules it is recommended that she have biopsied based on size.  I will place referral for biopsy and endocrinology consult. Thyroid function testing is normal. A1C is 6.2.

## 2017-08-23 NOTE — Patient Instructions (Signed)
Please complete lab work prior to leaving.  Schedule ultrasound on the first floor.  

## 2017-08-23 NOTE — Addendum Note (Signed)
Addended by: Sandford Craze'SULLIVAN, Everrett Lacasse on: 08/23/2017 09:59 AM   Modules accepted: Orders

## 2017-08-23 NOTE — Telephone Encounter (Signed)
Lm for patient to call for us results, ok for triage nurse to release results to patient.

## 2017-08-23 NOTE — Telephone Encounter (Signed)
Please let pt know that thyroid ultrasound shows 2 nodules. One of the nodules it is recommended that she have biopsied based on size.  I will place referral for biopsy and endocrinology consult. Thyroid function testing is normal. A1C is 6.2.

## 2017-08-23 NOTE — Progress Notes (Signed)
Subjective:    Patient ID: Sara Finley, female    DOB: 10-May-1980, 37 y.o.   MRN: [id number]  HPI  Patient is a 37 year old female who presents today for follow-up.  Hyperglycemia-working on weight loss.  Lab Results  Component Value Date   HGBA1C 6.1 01/10/2017    Wt Readings from Last 3 Encounters:  08/23/17 177 lb 6.4 oz (80.5 kg)  01/10/17 183 lb 3.2 oz (83.1 kg)  11/24/15 173 lb 6.4 oz (78.7 kg)    Neck Nodule-reports that she noted a nodule on her ? thyroid.  Non-tender. Notes that she has had some fatigue recently. Has had weight loss but this has been intentional.  Reports that she took a round of abx when she was home earlier this month in [place]. Notes vaginal discharge/itching and odor. Requesting rx for diflucan and metrogel.   Review of Systems See HPI  Past Medical History:  Diagnosis Date  . GERD (gastroesophageal reflux disease)   . Hyperglycemia   . Stress incontinence      Social History   Socioeconomic History  . Marital status: Married    Spouse name: Not on file  . Number of children: Not on file  . Years of education: Not on file  . Highest education level: Not on file  Occupational History  . Not on file  Social Needs  . Financial resource strain: Not on file  . Food insecurity:    Worry: Not on file    Inability: Not on file  . Transportation needs:    Medical: Not on file    Non-medical: Not on file  Tobacco Use  . Smoking status: Never Smoker  . Smokeless tobacco: Never Used  Substance and Sexual Activity  . Alcohol use: Yes    Alcohol/week: 0.0 oz    Comment: "sparingly"  . Drug use: No  . Sexual activity: Not Currently    Birth control/protection: Spermicide    Comment: "Not for the last month":  Lifestyle  . Physical activity:    Days per week: Not on file    Minutes per session: Not on file  . Stress: Not on file  Relationships  . Social connections:    Talks on phone: Not on file    Gets together: Not on file     Attends religious service: Not on file    Active member of club or organization: Not on file    Attends meetings of clubs or organizations: Not on file    Relationship status: Not on file  . Intimate partner violence:    Fear of current or ex partner: Not on file    Emotionally abused: Not on file    Physically abused: Not on file    Forced sexual activity: Not on file  Other Topics Concern  . 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 APPENDECTOMY    . BREAST ENHANCEMENT SURGERY    . UMBILICAL HERNIA REPAIR  @ age 37    Family History  Problem Relation Age of Onset  . Hypertension Mother   . Hyperlipidemia Mother   . Hypertension Paternal Grandmother   . Stroke Paternal  Grandmother   . Hypertension Father   . Hyperlipidemia Father   . Diabetes Father        ?pre-diabetes  . Benign prostatic hyperplasia Father   . Sleep apnea Father   . Asthma Father   . Cataracts Father   . Hypertension Paternal Uncle   . Heart disease Paternal Uncle 50       MI/CABG x 4- died after failed bipass  . Sleep apnea Brother     Allergies  Allergen Reactions  . Asa [Aspirin] Other (See Comments)    gastriitis  . Belviq [Lorcaserin Hcl] Other (See Comments)    Dizziness, nausea    Current Outpatient Medications on File Prior to Visit  Medication Sig Dispense Refill  . cetirizine (ZYRTEC) 10 MG tablet Take 10 mg by mouth daily as needed for allergies.    Marland Kitchen docusate sodium (COLACE) 100 MG capsule Take 100 mg by mouth as needed for mild constipation.    . fluticasone (FLONASE) 50 MCG/ACT nasal spray Place 2 sprays into both nostrils daily. (Patient taking differently: Place 2 sprays into both nostrils daily as  needed. ) 16 g 5  . omeprazole (PRILOSEC) 20 MG capsule Take 1 capsule (20 mg total) by mouth daily. 30 capsule 5   No current facility-administered medications on file prior to visit.     BP 100/66 (BP Location: Right Arm, Patient Position: Sitting, Cuff Size: Large)   Pulse 80   Temp 98.8 F (37.1 C) (Oral)   Resp 16   Ht 5\' 3"  (1.6 m)   Wt 177 lb 6.4 oz (80.5 kg)   LMP 08/06/2017   SpO2 99%   BMI 31.42 kg/m       Objective:   Physical Exam  Constitutional: She appears well-developed and well-nourished.  Neck: Neck supple.  Firm right sided thyroid nodule  Cardiovascular: Normal rate, regular rhythm and normal heart sounds.  No murmur heard. Pulmonary/Chest: Effort normal and breath sounds normal. No respiratory distress. She has no wheezes.  Psychiatric: She has a normal mood and affect. Her behavior is normal. Judgment and thought content normal.          Assessment & Plan:  Thyroid nodule- check tft's, [place] to further evaluation.  Hyperglycemia- commended her on her weight loss. Check follow up A1C.  Vaginitis- rx sent for metrogel and diflucan.

## 2017-08-24 NOTE — Telephone Encounter (Signed)
Patient called back and got results from triage, she is [facility], information given to [place] to arrange referral.

## 2017-08-28 ENCOUNTER — Encounter: Payer: Self-pay | Admitting: Family

## 2017-08-29 NOTE — Telephone Encounter (Signed)
Gwen please see request for endo referral change.

## 2017-09-06 ENCOUNTER — Ambulatory Visit
Admission: RE | Admit: 2017-09-06 | Discharge: 2017-09-06 | Disposition: A | Discharge status: Home / Self Care | Payer: 59 | Source: Ambulatory Visit | Attending: Family | Admitting: Family

## 2017-09-06 ENCOUNTER — Telehealth: Payer: Self-pay | Admitting: *Deleted

## 2017-09-06 ENCOUNTER — Other Ambulatory Visit: Payer: Self-pay | Admitting: Family

## 2017-09-06 ENCOUNTER — Other Ambulatory Visit (HOSPITAL_COMMUNITY)
Admission: RE | Admit: 2017-09-06 | Discharge: 2017-09-06 | Disposition: A | Discharge status: Home / Self Care | Payer: 59 | Source: Ambulatory Visit | Attending: Radiology | Admitting: Radiology

## 2017-09-06 DIAGNOSIS — E041 Nontoxic single thyroid nodule: Secondary | ICD-10-CM

## 2017-09-06 NOTE — Telephone Encounter (Signed)
Reviewed and agree.

## 2017-09-06 NOTE — Telephone Encounter (Signed)
[facility] the [facility] stating pt is there for thyroid biopsy and is requesting to have Afirma done as well. Gave verbal authorization per PCP, Peggyann Juba'Sullivan, NP.

## 2017-09-10 ENCOUNTER — Encounter: Payer: Self-pay | Admitting: Family

## 2017-09-13 DIAGNOSIS — A630 Anogenital (venereal) warts: Secondary | ICD-10-CM | POA: Diagnosis not present

## 2017-09-13 DIAGNOSIS — Z01419 Encounter for gynecological examination (general) (routine) without abnormal findings: Secondary | ICD-10-CM | POA: Diagnosis not present

## 2017-09-19 ENCOUNTER — Encounter: Payer: Self-pay | Admitting: Family

## 2017-09-19 NOTE — Telephone Encounter (Signed)
Dr Wendling -- can you advise in PCP's absence? 

## 2017-09-24 NOTE — Telephone Encounter (Signed)
Could you please call [facility]opathology at [facility] and check status of her AFIRMA results on her thyroid biopsy?

## 2017-09-25 NOTE — Telephone Encounter (Signed)
Melissa -- I spoke with Maralyn SagoSarah in cytology. AFIRMA result is Category 2 or benign. Per the manufacturer, they only report atypical results (Category 3 or 4) which is why we did not receive anything back from cytology.

## 2017-10-18 DIAGNOSIS — R7303 Prediabetes: Secondary | ICD-10-CM | POA: Diagnosis not present

## 2017-10-18 DIAGNOSIS — E559 Vitamin D deficiency, unspecified: Secondary | ICD-10-CM | POA: Diagnosis not present

## 2017-10-18 DIAGNOSIS — E041 Nontoxic single thyroid nodule: Secondary | ICD-10-CM | POA: Diagnosis not present

## 2017-10-18 DIAGNOSIS — E669 Obesity, unspecified: Secondary | ICD-10-CM | POA: Diagnosis not present

## 2017-10-19 DIAGNOSIS — E041 Nontoxic single thyroid nodule: Secondary | ICD-10-CM | POA: Insufficient documentation

## 2017-11-08 DIAGNOSIS — M2609 Other specified anomalies of jaw size: Secondary | ICD-10-CM | POA: Diagnosis not present

## 2017-11-14 ENCOUNTER — Encounter: Payer: Self-pay | Admitting: Family

## 2017-12-03 ENCOUNTER — Encounter: Payer: Self-pay | Admitting: Family

## 2018-02-15 ENCOUNTER — Ambulatory Visit (INDEPENDENT_AMBULATORY_CARE_PROVIDER_SITE_OTHER): Payer: 59 | Admitting: Family

## 2018-02-15 ENCOUNTER — Other Ambulatory Visit (INDEPENDENT_AMBULATORY_CARE_PROVIDER_SITE_OTHER): Payer: 59

## 2018-02-15 DIAGNOSIS — R739 Hyperglycemia, unspecified: Secondary | ICD-10-CM

## 2018-02-15 DIAGNOSIS — E041 Nontoxic single thyroid nodule: Secondary | ICD-10-CM | POA: Diagnosis not present

## 2018-02-15 LAB — CBC
HCT: 39.2 % (ref 36.0–46.0)
HEMOGLOBIN: 13 g/dL (ref 12.0–15.0)
MCHC: 33.1 g/dL (ref 30.0–36.0)
MCV: 85.2 fl (ref 78.0–100.0)
Platelets: 309 10*3/uL (ref 150.0–400.0)
RBC: 4.6 Mil/uL (ref 3.87–5.11)
RDW: 13.1 % (ref 11.5–15.5)
WBC: 5.3 10*3/uL (ref 4.0–10.5)

## 2018-02-15 LAB — LIPID PANEL
CHOLESTEROL: 165 mg/dL (ref 0–200)
HDL: 53.4 mg/dL (ref >39.00)
LDL Cholesterol: 102 mg/dL — ABNORMAL HIGH (ref 0–99)
NonHDL: 112.06
TRIGLYCERIDES: 50 mg/dL (ref 0.0–149.0)
Total CHOL/HDL Ratio: 3
VLDL: 10 mg/dL (ref 0.0–40.0)

## 2018-02-15 LAB — TSH: TSH: 1.01 u[IU]/mL (ref 0.35–4.50)

## 2018-02-15 LAB — HEMOGLOBIN A1C: Hgb A1c MFr Bld: 6.1 % (ref 4.6–6.5)

## 2018-02-22 ENCOUNTER — Telehealth: Payer: 59 | Admitting: Family

## 2018-02-22 DIAGNOSIS — B9689 Other specified bacterial agents as the cause of diseases classified elsewhere: Secondary | ICD-10-CM | POA: Diagnosis not present

## 2018-02-22 DIAGNOSIS — J208 Acute bronchitis due to other specified organisms: Secondary | ICD-10-CM | POA: Diagnosis not present

## 2018-02-22 MED ORDER — PREDNISONE 5 MG PO TABS: 5.0000 mg | ORAL_TABLET | ORAL | 0 refills | Status: DC

## 2018-02-22 MED ORDER — ALBUTEROL SULFATE 108 (90 BASE) MCG/ACT IN AEPB: 2.0000 | INHALATION_SPRAY | Freq: Four times a day (QID) | RESPIRATORY_TRACT | 2 refills | Status: DC | PRN

## 2018-02-22 MED ORDER — BENZONATATE 100 MG PO CAPS: 100.0000 mg | ORAL_CAPSULE | Freq: Three times a day (TID) | ORAL | 0 refills | Status: DC | PRN

## 2018-02-22 MED ORDER — AZITHROMYCIN 250 MG PO TABS: ORAL_TABLET | ORAL | 0 refills | Status: DC

## 2018-02-22 NOTE — Progress Notes (Signed)
Thank you for the details you included in the comment boxes. Those details are very helpful in determining the best course of treatment for you and help us to provide the best care. There are no good liquid options for all the meds. I did search for options. I'm also sending an albuterol inhaler; see below.   We are sorry that you are not feeling well.  Here is how we plan to help!  Based on your presentation I believe you most likely have A cough due to bacteria.  When patients have a fever and a productive cough with a change in color or increased sputum production, we are concerned about bacterial bronchitis.  If left untreated it can progress to pneumonia.  If your symptoms do not improve with your treatment plan it is important that you contact your provider.   I have prescribed Azithromyin 250 mg: two tablets now and then one tablet daily for 4 additonal days    In addition you may use A non-prescription cough medication called Robitussin DAC. Take 2 teaspoons every 8 hours or Delsym: take 2 teaspoons every 12 hours. and A prescription cough medication called Tessalon Perles 100mg . You may take 1-2 capsules every 8 hours as needed for your cough.  Prednisone 5 mg daily for 6 days (see taper instructions below)  Directions for 6 day taper: Day 1: 2 tablets before breakfast, 1 after both lunch & dinner and 2 at bedtime Day 2: 1 tab before breakfast, 1 after both lunch & dinner and 2 at bedtime Day 3: 1 tab at each meal & 1 at bedtime Day 4: 1 tab at breakfast, 1 at lunch, 1 at bedtime Day 5: 1 tab at breakfast & 1 tab at bedtime Day 6: 1 tab at breakfast   From your responses in the eVisit questionnaire you describe inflammation in the upper respiratory tract which is causing a significant cough.  This is commonly called Bronchitis and has four common causes:    Allergies  Viral Infections  Acid Reflux  Bacterial Infection Allergies, viruses and acid reflux are treated by controlling  symptoms or eliminating the cause. An example might be a cough caused by taking certain blood pressure medications. You stop the cough by changing the medication. Another example might be a cough caused by acid reflux. Controlling the reflux helps control the cough.  USE OF BRONCHODILATOR ("RESCUE") INHALERS: There is a risk from using your bronchodilator too frequently.  The risk is that over-reliance on a medication which only relaxes the muscles surrounding the breathing tubes can reduce the effectiveness of medications prescribed to reduce swelling and congestion of the tubes themselves.  Although you feel brief relief from the bronchodilator inhaler, your asthma may actually be worsening with the tubes becoming more swollen and filled with mucus.  This can delay other crucial treatments, such as oral steroid medications. If you need to use a bronchodilator inhaler daily, several times per day, you should discuss this with your provider.  There are probably better treatments that could be used to keep your asthma under control.     HOME CARE . Only take medications as instructed by your medical team. . Complete the entire course of an antibiotic. . Drink plenty of fluids and get plenty of rest. . Avoid close contacts especially the very young and the elderly . Cover your mouth if you cough or cough into your sleeve. . Always remember to wash your hands . A steam or ultrasonic humidifier can  help congestion.   GET HELP RIGHT AWAY IF: . You develop worsening fever. . You become short of breath . You cough up blood. . Your symptoms persist after you have completed your treatment plan MAKE SURE YOU   Understand these instructions.  Will watch your condition.  Will get help right away if you are not doing well or get worse.  Your e-visit answers were reviewed by a board certified advanced clinical practitioner to complete your personal care plan.  Depending on the condition, your plan could  have included both over the counter or prescription medications. If there is a problem please reply  once you have received a response from your provider. Your safety is important to [place].  If you have drug allergies check your prescription carefully.    You can use MyChart to ask questions about today's visit, request a non-urgent call back, or ask for a work or school excuse for 24 hours related to this e-Visit. If it has been greater than 24 hours you will need to follow up with your provider, or enter a new e-Visit to address those concerns. You will get an e-mail in the next two days asking about your experience.  I hope that your e-visit has been valuable and will speed your recovery. Thank you for using e-visits.

## 2018-03-15 ENCOUNTER — Encounter: Payer: Self-pay | Admitting: Family

## 2018-03-15 ENCOUNTER — Ambulatory Visit (INDEPENDENT_AMBULATORY_CARE_PROVIDER_SITE_OTHER): Payer: 59 | Admitting: Family

## 2018-03-15 VITALS — BP 103/73 | HR 73 | Temp 98.6°F | Resp 16 | Ht 63.0 in | Wt 180.0 lb

## 2018-03-15 DIAGNOSIS — R1011 Right upper quadrant pain: Secondary | ICD-10-CM | POA: Diagnosis not present

## 2018-03-15 DIAGNOSIS — Z0000 Encounter for general adult medical examination without abnormal findings: Secondary | ICD-10-CM | POA: Diagnosis not present

## 2018-03-15 DIAGNOSIS — E041 Nontoxic single thyroid nodule: Secondary | ICD-10-CM

## 2018-03-15 DIAGNOSIS — R739 Hyperglycemia, unspecified: Secondary | ICD-10-CM

## 2018-03-15 LAB — URINALYSIS, ROUTINE W REFLEX MICROSCOPIC
Bilirubin Urine: NEGATIVE
Hgb urine dipstick: NEGATIVE
Ketones, ur: NEGATIVE
Leukocytes, UA: NEGATIVE
Nitrite: NEGATIVE
Specific Gravity, Urine: 1.02 (ref 1.000–1.030)
Total Protein, Urine: NEGATIVE
Urine Glucose: NEGATIVE
Urobilinogen, UA: 0.2 (ref 0.0–1.0)
WBC, UA: NONE SEEN (ref 0–?)
pH: 6.5 (ref 5.0–8.0)

## 2018-03-15 LAB — BASIC METABOLIC PANEL
BUN: 12 mg/dL (ref 6–23)
CO2: 29 meq/L (ref 19–32)
Calcium: 9.7 mg/dL (ref 8.4–10.5)
Chloride: 104 mEq/L (ref 96–112)
Creatinine, Ser: 0.71 mg/dL (ref 0.40–1.20)
GFR: 111.47 mL/min (ref >60.00)
GLUCOSE: 87 mg/dL (ref 70–99)
Potassium: 5.1 mEq/L (ref 3.5–5.1)
Sodium: 141 mEq/L (ref 135–145)

## 2018-03-15 NOTE — Patient Instructions (Addendum)
Please schedule a routine eye exam.   Continue healthy diet, exercise and weigh tloss efforts.

## 2018-03-15 NOTE — Progress Notes (Signed)
Subjective:    Patient ID: Sara Finley, female    DOB: 1980/10/19, 38 y.o.   MRN: [id number]  HPI  Patient presents today for complete physical.  Immunizations: tetanus is up to date, flu shot up to date Diet: working on healthy diet Exercise:  Not regular Pap Smear: 7/19 Vision: 2 yrs ago Dental:  Up to date   Hyperglycemia-  Lab Results  Component Value Date   HGBA1C 6.1 02/15/2018   Chronic nausea- worsens if she goes greater than 1 hour without eating. Worse with greasy food.  Occasional RUQ pain. Uses prilosec PRN.  Notes that she can't eat food prepared at restaurants due to bloating/gas/abdominal discomfort.   Thyroid nodule- had benign biopsy. Requesting follow up US per endo recommendations.  Review of Systems  Constitutional: Negative for unexpected weight change.  HENT: Negative for hearing loss and rhinorrhea.   Eyes: Negative for visual disturbance.  Respiratory: Negative for cough and shortness of breath.   Cardiovascular: Negative for chest pain.  Gastrointestinal:       Reports chronic nausea.  Reports if she goes 1 hour without eating.  Occasional constipation  Genitourinary: Negative for dysuria, frequency and hematuria.  Musculoskeletal: Negative for arthralgias and myalgias.  Skin: Negative for rash.  Neurological:       Occasional caffeine withdrawal HA's  Hematological: Negative for adenopathy.  Psychiatric/Behavioral:       Denies depression/anxiety    Past Medical History:  Diagnosis Date  . GERD (gastroesophageal reflux disease)   . Hyperglycemia   . Stress incontinence      Social History   Socioeconomic History  . Marital status: Married    Spouse name: Not on file  . Number of children: Not on file  . Years of education: Not on file  . Highest education level: Not on file  Occupational History  . Not on file  Social Needs  . Financial resource strain: Not on file  . Food insecurity:    Worry: Not on file    Inability:  Not on file  . Transportation needs:    Medical: Not on file    Non-medical: Not on file  Tobacco Use  . Smoking status: Never Smoker  . Smokeless tobacco: Never Used  Substance and Sexual Activity  . Alcohol use: Yes    Alcohol/week: 0.0 standard drinks    Comment: "sparingly"  . Drug use: No  . Sexual activity: Not Currently    Birth control/protection: Spermicide    Comment: "Not for the last month":  Lifestyle  . Physical activity:    Days per week: Not on file    Minutes per session: Not on file  . Stress: Not on file  Relationships  . Social connections:    Talks on phone: Not on file    Gets together: Not on file    Attends religious service: Not on file    Active member of club or organization: Not on file    Attends meetings of clubs or organizations: Not on file    Relationship status: Not on file  . Intimate partner violence:    Fear of current or ex partner: Not on file    Emotionally abused: Not on file    Physically abused: Not on file    Forced sexual activity: Not on file  Other Topics Concern  . 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 APPENDECTOMY    . BREAST ENHANCEMENT SURGERY    . UMBILICAL HERNIA REPAIR  @ age 40    Family History  Problem Relation Age of Onset  . Hypertension Mother   . Hyperlipidemia Mother   . Diabetes Mellitus II Mother   . Hypertension Paternal Grandmother   . Stroke Paternal Grandmother   . Hypertension Father   . Hyperlipidemia Father   . Diabetes Father        ?pre-diabetes  . Benign prostatic hyperplasia Father   . Sleep apnea Father   . Asthma Father   . Cataracts Father   . Hypertension Paternal Uncle   . Heart disease Paternal  Uncle 50       MI/CABG x 4- died after failed bipass  . Sleep apnea Brother   . Anemia Sister        hyperglycemia  . Sickle cell trait Sister   . Goiter Sister     Allergies  Allergen Reactions  . Asa [Aspirin] Other (See Comments)    gastriitis  . Belviq [Lorcaserin Hcl] Other (See Comments)    Dizziness, nausea    Current Outpatient Medications on File Prior to Visit  Medication Sig Dispense Refill  . Albuterol Sulfate (PROAIR RESPICLICK) 108 (90 Base) MCG/ACT AEPB Inhale 2 puffs into the lungs every 6 (six) hours as needed (shortness of breath). 1 each 2  . cetirizine (ZYRTEC) 10 MG tablet Take 10 mg by mouth daily as needed for allergies.    Marland Kitchen docusate sodium (COLACE) 100 MG capsule Take 100 mg by mouth as needed for mild constipation.    Marland Kitchen omeprazole (PRILOSEC) 20 MG capsule Take 1 capsule (20 mg total) by mouth daily. 30 capsule 5   No current facility-administered medications on file prior to visit.     BP 103/73 (BP Location: Right Arm, Patient Position: Sitting, Cuff Size: Small)   Pulse 73   Temp 98.6 F (37 C) (Oral)   Resp 16   Ht 5\' 3"  (1.6 m)   Wt 180 lb (81.6 kg)   LMP 03/03/2018   SpO2 100%   BMI 31.89 kg/m        Objective:   Physical Exam  Physical Exam  Constitutional: She is oriented to person, place, and time. She appears well-developed and well-nourished. No distress.  HENT:  Head: Normocephalic and atraumatic.  Right Ear: Tympanic membrane and ear canal normal.  Left Ear: Tympanic membrane and ear canal normal.  Mouth/Throat: Oropharynx is clear and moist.  Eyes: Pupils are equal, round, and reactive to light. No scleral icterus.  Neck: Normal range of motion. Mild thyromegaly present.  Cardiovascular: Normal rate and regular rhythm.   No murmur heard. Pulmonary/Chest: Effort normal and breath sounds normal. No respiratory distress. He has no wheezes. She has no rales. She exhibits no tenderness.  Abdominal: Soft. Bowel sounds are  normal. She exhibits no distension and no mass. There is no tenderness. There is no rebound and no guarding.  Musculoskeletal: She exhibits no edema.  Lymphadenopathy:    She has no cervical adenopathy.  Neurological: She is alert and oriented to person, place, and time. She has normal patellar reflexes. She exhibits normal muscle tone. Coordination normal.  Skin: Skin is warm and  dry.  Psychiatric: She has a normal mood and affect. Her behavior is normal. Judgment and thought content normal.  Breast/pelvic: deferred           Assessment & Plan:   Preventative care- discussed healthy diet, exercise she can do at home.  Obtain UA/bmet. Other labs are up to date.  Pap up to date/immunizations reviewed and up to date.  Chronic nausea/RUQ pain- states she has been treated in the past for H pylori and follow up testing was negative. Will obtain US to rule out cholecystitis. If unrevealing will plan referral to GI for endoscopy.  Thyroid nodules- obtain follow up US to ensure stability. TSH is stable.  Lab Results  Component Value Date   TSH 1.01 02/15/2018   Hyperglycemia- A1C is stable.  Lab Results  Component Value Date   HGBA1C 6.1 02/15/2018         Assessment & Plan:

## 2018-04-11 ENCOUNTER — Ambulatory Visit
Admission: RE | Admit: 2018-04-11 | Discharge: 2018-04-11 | Disposition: A | Discharge status: Home / Self Care | Payer: 59 | Source: Ambulatory Visit | Attending: Family | Admitting: Family

## 2018-04-11 ENCOUNTER — Other Ambulatory Visit: Payer: 59

## 2018-04-11 ENCOUNTER — Encounter: Payer: Self-pay | Admitting: Family

## 2018-04-11 DIAGNOSIS — E042 Nontoxic multinodular goiter: Secondary | ICD-10-CM | POA: Diagnosis not present

## 2018-04-11 DIAGNOSIS — E041 Nontoxic single thyroid nodule: Secondary | ICD-10-CM

## 2018-04-22 ENCOUNTER — Encounter: Payer: Self-pay | Admitting: Family

## 2018-09-04 ENCOUNTER — Encounter: Payer: Self-pay | Admitting: Family

## 2018-09-04 DIAGNOSIS — Z01818 Encounter for other preprocedural examination: Secondary | ICD-10-CM

## 2018-09-19 DIAGNOSIS — F331 Major depressive disorder, recurrent, moderate: Secondary | ICD-10-CM | POA: Diagnosis not present

## 2018-09-25 ENCOUNTER — Other Ambulatory Visit (INDEPENDENT_AMBULATORY_CARE_PROVIDER_SITE_OTHER): Payer: 59

## 2018-09-25 DIAGNOSIS — Z01818 Encounter for other preprocedural examination: Secondary | ICD-10-CM

## 2018-09-25 LAB — CBC WITH DIFFERENTIAL/PLATELET
Basophils Absolute: 0 10*3/uL (ref 0.0–0.1)
Basophils Relative: 0.6 % (ref 0.0–3.0)
Eosinophils Absolute: 0.1 10*3/uL (ref 0.0–0.7)
Eosinophils Relative: 1 % (ref 0.0–5.0)
HCT: 40.8 % (ref 36.0–46.0)
Hemoglobin: 13.1 g/dL (ref 12.0–15.0)
Lymphocytes Relative: 31.8 % (ref 12.0–46.0)
Lymphs Abs: 2.6 10*3/uL (ref 0.7–4.0)
MCHC: 32.1 g/dL (ref 30.0–36.0)
MCV: 85.4 fl (ref 78.0–100.0)
Monocytes Absolute: 0.6 10*3/uL (ref 0.1–1.0)
Monocytes Relative: 6.8 % (ref 3.0–12.0)
Neutro Abs: 4.9 10*3/uL (ref 1.4–7.7)
Neutrophils Relative %: 59.8 % (ref 43.0–77.0)
Platelets: 293 10*3/uL (ref 150.0–400.0)
RBC: 4.77 Mil/uL (ref 3.87–5.11)
RDW: 13.4 % (ref 11.5–15.5)
WBC: 8.1 10*3/uL (ref 4.0–10.5)

## 2018-09-26 ENCOUNTER — Encounter: Payer: Self-pay | Admitting: Family

## 2018-09-26 NOTE — Telephone Encounter (Signed)
Sara Finley, can you please fax cbc results- see mychart message.

## 2018-09-27 DIAGNOSIS — F329 Major depressive disorder, single episode, unspecified: Secondary | ICD-10-CM | POA: Diagnosis not present

## 2018-09-27 NOTE — Telephone Encounter (Signed)
Labs faxed to Dr. Stephanie Coup at fax # provided

## 2018-09-28 HISTORY — PX: BREAST ENHANCEMENT SURGERY: SHX7

## 2018-10-09 ENCOUNTER — Other Ambulatory Visit: Payer: Self-pay

## 2018-10-10 ENCOUNTER — Encounter: Payer: Self-pay | Admitting: Obstetrics and Gynecology

## 2018-10-10 ENCOUNTER — Ambulatory Visit (INDEPENDENT_AMBULATORY_CARE_PROVIDER_SITE_OTHER): Payer: 59 | Admitting: Obstetrics and Gynecology

## 2018-10-10 VITALS — BP 108/76 | HR 64 | Temp 97.8°F | Ht 63.0 in | Wt 185.0 lb

## 2018-10-10 DIAGNOSIS — L986 Other infiltrative disorders of the skin and subcutaneous tissue: Secondary | ICD-10-CM | POA: Diagnosis not present

## 2018-10-10 DIAGNOSIS — Z01419 Encounter for gynecological examination (general) (routine) without abnormal findings: Secondary | ICD-10-CM | POA: Diagnosis not present

## 2018-10-10 DIAGNOSIS — N9089 Other specified noninflammatory disorders of vulva and perineum: Secondary | ICD-10-CM | POA: Diagnosis not present

## 2018-10-10 NOTE — Patient Instructions (Signed)
EXERCISE AND DIET:  We recommended that you start or continue a regular exercise program for good health. Regular exercise means any activity that makes your heart beat faster and makes you sweat.  We recommend exercising at least 30 minutes per day at least 3 days a week, preferably 4 or 5.  We also recommend a diet low in fat and sugar.  Inactivity, poor dietary choices and obesity can cause diabetes, heart attack, stroke, and kidney damage, among others.    ALCOHOL AND SMOKING:  Women should limit their alcohol intake to no more than 7 drinks/beers/glasses of wine (combined, not each!) per week. Moderation of alcohol intake to this level decreases your risk of breast cancer and liver damage. And of course, no recreational drugs are part of a healthy lifestyle.  And absolutely no smoking or even second hand smoke. Most people know smoking can cause heart and lung diseases, but did you know it also contributes to weakening of your bones? Aging of your skin?  Yellowing of your teeth and nails?  CALCIUM AND VITAMIN D:  Adequate intake of calcium and Vitamin D are recommended.  The recommendations for exact amounts of these supplements seem to change often, but generally speaking 1,000 mg of calcium (between diet and supplement) and 800 units of Vitamin D per day seems prudent. Certain women may benefit from higher intake of Vitamin D.  If you are among these women, your doctor will have told you during your visit.    PAP SMEARS:  Pap smears, to check for cervical cancer or precancers,  have traditionally been done yearly, although recent scientific advances have shown that most women can have pap smears less often.  However, every woman still should have a physical exam from her gynecologist every year. It will include a breast check, inspection of the vulva and vagina to check for abnormal growths or skin changes, a visual exam of the cervix, and then an exam to evaluate the size and shape of the uterus and  ovaries.  And after 38 years of age, a rectal exam is indicated to check for rectal cancers. We will also provide age appropriate advice regarding health maintenance, like when you should have certain vaccines, screening for sexually transmitted diseases, bone density testing, colonoscopy, mammograms, etc.   MAMMOGRAMS:  All women over 40 years old should have a yearly mammogram. Many facilities now offer a "3D" mammogram, which may cost around $50 extra out of pocket. If possible,  we recommend you accept the option to have the 3D mammogram performed.  It both reduces the number of women who will be called back for extra views which then turn out to be normal, and it is better than the routine mammogram at detecting truly abnormal areas.    COLON CANCER SCREENING: Now recommend starting at age 45. At this time colonoscopy is not covered for routine screening until 50. There are take home tests that can be done between 45-49.   COLONOSCOPY:  Colonoscopy to screen for colon cancer is recommended for all women at age 50.  We know, you hate the idea of the prep.  We agree, BUT, having colon cancer and not knowing it is worse!!  Colon cancer so often starts as a polyp that can be seen and removed at colonscopy, which can quite literally save your life!  And if your first colonoscopy is normal and you have no family history of colon cancer, most women don't have to have it again for   10 years.  Once every ten years, you can do something that may end up saving your life, right?  We will be happy to help you get it scheduled when you are ready.  Be sure to check your insurance coverage so you understand how much it will cost.  It may be covered as a preventative service at no cost, but you should check your particular policy.      Breast Self-Awareness Breast self-awareness means being familiar with how your breasts look and feel. It involves checking your breasts regularly and reporting any changes to your  health care provider. Practicing breast self-awareness is important. A change in your breasts can be a sign of a serious medical problem. Being familiar with how your breasts look and feel allows you to find any problems early, when treatment is more likely to be successful. All women should practice breast self-awareness, including women who have had breast implants. How to do a breast self-exam One way to learn what is normal for your breasts and whether your breasts are changing is to do a breast self-exam. To do a breast self-exam: Look for Changes  1. Remove all the clothing above your waist. 2. Stand in front of a mirror in a room with good lighting. 3. Put your hands on your hips. 4. Push your hands firmly downward. 5. Compare your breasts in the mirror. Look for differences between them (asymmetry), such as: ? Differences in shape. ? Differences in size. ? Puckers, dips, and bumps in one breast and not the other. 6. Look at each breast for changes in your skin, such as: ? Redness. ? Scaly areas. 7. Look for changes in your nipples, such as: ? Discharge. ? Bleeding. ? Dimpling. ? Redness. ? A change in position. Feel for Changes Carefully feel your breasts for lumps and changes. It is best to do this while lying on your back on the floor and again while sitting or standing in the shower or tub with soapy water on your skin. Feel each breast in the following way:  Place the arm on the side of the breast you are examining above your head.  Feel your breast with the other hand.  Start in the nipple area and make  inch (2 cm) overlapping circles to feel your breast. Use the pads of your three middle fingers to do this. Apply light pressure, then medium pressure, then firm pressure. The light pressure will allow you to feel the tissue closest to the skin. The medium pressure will allow you to feel the tissue that is a little deeper. The firm pressure will allow you to feel the tissue  close to the ribs.  Continue the overlapping circles, moving downward over the breast until you feel your ribs below your breast.  Move one finger-width toward the center of the body. Continue to use the  inch (2 cm) overlapping circles to feel your breast as you move slowly up toward your collarbone.  Continue the up and down exam using all three pressures until you reach your armpit.  Write Down What You Find  Write down what is normal for each breast and any changes that you find. Keep a written record with breast changes or normal findings for each breast. By writing this information down, you do not need to depend only on memory for size, tenderness, or location. Write down where you are in your menstrual cycle, if you are still menstruating. If you are having trouble noticing differences   in your breasts, do not get discouraged. With time you will become more familiar with the variations in your breasts and more comfortable with the exam. How often should I examine my breasts? Examine your breasts every month. If you are breastfeeding, the best time to examine your breasts is after a feeding or after using a breast pump. If you menstruate, the best time to examine your breasts is 5-7 days after your period is over. During your period, your breasts are lumpier, and it may be more difficult to notice changes. When should I see my health care provider? See your health care provider if you notice:  A change in shape or size of your breasts or nipples.  A change in the skin of your breast or nipples, such as a reddened or scaly area.  Unusual discharge from your nipples.  A lump or thick area that was not there before.  Pain in your breasts.  Anything that concerns you.  Vulvar Biopsy Post-procedure Instructions . You may take Ibuprofen, Aleve, or Tylenol for any pain or discomfort. . You may have a small amount of spotting.  You should wear a mini pad for the next few days. . You  may use some topical Neosporin ointment (over the counter) if you would like.   Marland Kitchen You will need to call the office if you have redness around the biopsy site, if there is any unusual drainage, if the bleeding is heavy, or if you have any concerns.   . Shower or bathe as normal . You will be notified within one week of your biopsy results or we will discuss your results at your follow-up appointment if needed.

## 2018-10-10 NOTE — Progress Notes (Signed)
38 y.o. [id number] Separated Black or African American Not Hispanic or Latino female here for annual exam.   She has a wart on her vulva, has grown in the last year and has become itchy. She wants it removed.  She previously had an elevated prolactin, f/u was normal. Was checked after PMP amenorrhea.   She has a thyroid nodule. Thyroid function has been normal. She has had 2 thyroid ultrasound.   Menses q month x 5-7 days, she saturated a pad in 4 hours. No BTB, no cramps. She is separated, no plans to be sexually active.   She is pre-diabetic. She is trying to loose weight, cutting down on carbs, portion size, exercising.     Patient's last menstrual period was 10/05/2018 (exact date).          Sexually active: No.  The current method of family planning is abstinence.    Exercising: Yes.    walking Smoker:  no  Health Maintenance: Pap:  09/13/2017 normal, negative hpv.  History of abnormal Pap:  Yes. +HPV ~2010 MMG:  Never BMD:   n/a Colonoscopy: n/a TDaP:  2018 Gardasil: no   reports that she has never smoked. She has never used smokeless tobacco. She reports current alcohol use. She reports that she does not use drugs. 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 [place]   Past Medical History:  Diagnosis Date  . Abnormal Pap smear of cervix 2010   + HPV. normal pap since   . GERD (gastroesophageal reflux disease)   . Hyperglycemia   . Prediabetes   . Stress incontinence     Past Surgical History:  Procedure Laterality Date  . APPENDECTOMY    . BREAST ENHANCEMENT SURGERY    . BREAST ENHANCEMENT SURGERY  09/2018   Removal   . COLPOSCOPY  ~2010   Normal  . UMBILICAL HERNIA REPAIR  @ age 38    Current Outpatient Medications  Medication Sig Dispense Refill  . buPROPion (WELLBUTRIN SR) 100 MG 12 hr tablet Take 1 tablet by mouth daily.    . cetirizine (ZYRTEC) 10 MG tablet Take 10 mg by mouth  daily as needed for allergies.    Marland Kitchen. docusate sodium (COLACE) 100 MG capsule Take 100 mg by mouth as needed for mild constipation.    Marland Kitchen. omeprazole (PRILOSEC) 20 MG capsule Take 1 capsule (20 mg total) by mouth daily. 30 capsule 5   No current facility-administered medications for this visit.     Family History  Problem Relation Age of Onset  . Hypertension Mother   . Hyperlipidemia Mother   . Diabetes Mellitus II Mother   . Hypertension Paternal Grandmother   . Stroke Paternal Grandmother   . Hypertension Father   . Hyperlipidemia Father   . Diabetes Father        ?pre-diabetes  . Benign prostatic hyperplasia Father   . Sleep apnea Father   . Asthma Father   . Cataracts Father   . Hypertension Paternal Uncle   . Heart disease Paternal Uncle 50       MI/CABG x 4- died after failed bipass  . Sleep apnea Brother   . Anemia Sister        hyperglycemia  . Sickle cell trait Sister   . Goiter Sister     Review of Systems  Constitutional: Negative.   HENT: Negative.   Eyes: Negative.   Respiratory: Negative.  Cardiovascular: Negative.   Gastrointestinal: Negative.   Endocrine: Negative.   Genitourinary: Negative.   Musculoskeletal: Negative.   Skin: Negative.   Allergic/Immunologic: Negative.   Neurological: Negative.   Hematological: Negative.   Psychiatric/Behavioral: Negative.     Exam:   BP 108/76   Pulse 64   Temp 97.8 F (36.6 C) (Temporal)   Ht 5\' 3"  (1.6 m)   Wt 185 lb (83.9 kg)   LMP 10/05/2018 (Exact Date)   BMI 32.77 kg/m   Weight change: @WEIGHTCHANGE @ Height:   Height: 5\' 3"  (160 cm)  Ht Readings from Last 3 Encounters:  10/10/18 5\' 3"  (1.6 m)  03/15/18 5\' 3"  (1.6 m)  08/23/17 5\' 3"  (1.6 m)    General appearance: alert, cooperative and appears stated age Head: Normocephalic, without obvious abnormality, atraumatic Neck: no adenopathy, supple, symmetrical, trachea midline and thyroid normal to inspection and palpation Lungs: clear to  auscultation bilaterally Cardiovascular: regular rate and rhythm Breasts: normal appearance, no masses or tenderness. Healing well from recent breast implant removal.  Abdomen: soft, non-tender; non distended,  no masses,  no organomegaly Extremities: extremities normal, atraumatic, no cyanosis or edema Skin: Skin color, texture, turgor normal. No rashes or lesions Lymph nodes: Cervical, supraclavicular, and axillary nodes normal. No abnormal inguinal nodes palpated Neurologic: Grossly normal   Pelvic: External genitalia:  Small white raised lesion on the right lower vulva.               Urethra:  normal appearing urethra with no masses, tenderness or lesions              Bartholins and Skenes: normal                 Vagina: normal appearing vagina with normal color and discharge, no lesions              Cervix: no lesions               Bimanual Exam:  Uterus:  normal size, contour, position, consistency, mobility, non-tender              Adnexa: no mass, fullness, tenderness               Rectovaginal: Confirms               Anus:  normal sphincter tone, no lesions  The risks of the procedure were reviewed with the patient and a consent was signed. The area was cleansed with Hibiclens  and injected with 1% lidocaine. A 5 mm punch biopsy was used to remove a circular piece of tissue. The defect was closed with 4-0 vicryl. The patient tolerated the procedure well.    Chaperone was present for exam.  A:  Well Woman with normal exam  Itchy vulvar lesion, growing, possible condyloma. Wants it removed.   P:   No pap this year  Labs with primary  Discussed breast self exam  Discussed calcium and vit D intake  Vulvar biopsy done

## 2018-10-25 DIAGNOSIS — F331 Major depressive disorder, recurrent, moderate: Secondary | ICD-10-CM | POA: Diagnosis not present

## 2018-10-29 HISTORY — PX: BREAST IMPLANT REMOVAL: SUR1101

## 2018-11-04 ENCOUNTER — Encounter: Payer: Self-pay | Admitting: Family

## 2018-11-05 ENCOUNTER — Ambulatory Visit (INDEPENDENT_AMBULATORY_CARE_PROVIDER_SITE_OTHER): Payer: 59 | Admitting: Family

## 2018-11-05 ENCOUNTER — Encounter: Payer: Self-pay | Admitting: Family

## 2018-11-05 VITALS — BP 109/80 | HR 71

## 2018-11-05 DIAGNOSIS — K219 Gastro-esophageal reflux disease without esophagitis: Secondary | ICD-10-CM | POA: Diagnosis not present

## 2018-11-05 DIAGNOSIS — E559 Vitamin D deficiency, unspecified: Secondary | ICD-10-CM

## 2018-11-05 DIAGNOSIS — R739 Hyperglycemia, unspecified: Secondary | ICD-10-CM | POA: Diagnosis not present

## 2018-11-05 DIAGNOSIS — F329 Major depressive disorder, single episode, unspecified: Secondary | ICD-10-CM

## 2018-11-05 DIAGNOSIS — E229 Hyperfunction of pituitary gland, unspecified: Secondary | ICD-10-CM

## 2018-11-05 DIAGNOSIS — R7989 Other specified abnormal findings of blood chemistry: Secondary | ICD-10-CM

## 2018-11-05 DIAGNOSIS — E041 Nontoxic single thyroid nodule: Secondary | ICD-10-CM

## 2018-11-05 DIAGNOSIS — F32A Depression, unspecified: Secondary | ICD-10-CM

## 2018-11-05 NOTE — Progress Notes (Signed)
Virtual Visit via Video Note  I connected with Sara Finley on 11/05/18 at  5:20 PM EDT by a video enabled telemedicine application and verified that I am speaking with the correct person using two identifiers.  Location: Patient: home  Provider: work   I discussed the limitations of evaluation and management by telemedicine and the availability of in person appointments. The patient expressed understanding and agreed to proceed.  History of Present Illness:  Patient is a 38 yr old female who presents today for follow up.  Hyperglycemia-  Lab Results  Component Value Date   HGBA1C 6.1 02/15/2018   Patient states that she is working currently on weight loss. Wt Readings from Last 3 Encounters:  10/10/18 185 lb (83.9 kg)  03/15/18 180 lb (81.6 kg)  08/23/17 177 lb 6.4 oz (80.5 kg)   GERD- uses omeprazole prn. Reports symptoms stable  History of elevated prolactin level-patient states that she realized she never followed up with endocrinology for follow-up prolactin level which was previously elevated.  She is requesting follow-up level to be drawn.  Glucose today- 101  Depression- established with Dr. Cristina Gong- in Network on virtual visit.  She noted depressed mood.  She was started on Wellbutrin.  She reports improved mood and improved energy/motivation on Wellbutrin.  She did note that she had some itching after she started this medication has added Zyrtec and Pepcid.  She did discuss this with her psychiatrist.  She stopped the medication for some time and now has restarted along with Zyrtec and Pepcid and is not having any itching symptoms.  Past Medical History:  Diagnosis Date  . Abnormal Pap smear of cervix 2010   + HPV. normal pap since   . GERD (gastroesophageal reflux disease)   . Hyperglycemia   . Prediabetes   . Stress incontinence      Social History   Socioeconomic History  . Marital status: Married    Spouse name: Not on file  . Number of children: Not  on file  . Years of education: Not on file  . Highest education level: Not on file  Occupational History  . Not on file  Social Needs  . Financial resource strain: Not on file  . Food insecurity    Worry: Not on file    Inability: Not on file  . Transportation needs    Medical: Not on file    Non-medical: Not on file  Tobacco Use  . Smoking status: Never Smoker  . Smokeless tobacco: Never Used  Substance and Sexual Activity  . Alcohol use: Yes    Alcohol/week: 0.0 standard drinks  . Drug use: No  . Sexual activity: Not Currently    Birth control/protection: Abstinence  Lifestyle  . Physical activity    Days per week: Not on file    Minutes per session: Not on file  . Stress: Not on file  Relationships  . Social Herbalist on phone: Not on file    Gets together: Not on file    Attends religious service: Not on file    Active member of club or organization: Not on file    Attends meetings of clubs or organizations: Not on file    Relationship status: Not on file  . Intimate partner violence    Fear of current or ex partner: Not on file    Emotionally abused: Not on file    Physically abused: Not on file    Forced sexual activity:  Not on file  Other Topics Concern  . 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 APPENDECTOMY    . BREAST ENHANCEMENT SURGERY    . BREAST ENHANCEMENT SURGERY  09/2018   Removal   . BREAST IMPLANT REMOVAL  10/2018  . COLPOSCOPY  ~2010   Normal  . UMBILICAL HERNIA REPAIR  @ age 38    Family History  Problem Relation Age of Onset  . Hypertension Mother   . Hyperlipidemia Mother   . Diabetes Mellitus II Mother   . Hypertension Paternal Grandmother   .  Stroke Paternal Grandmother   . Hypertension Father   . Hyperlipidemia Father   . Diabetes Father        ?pre-diabetes  . Benign prostatic hyperplasia Father   . Sleep apnea Father   . Asthma Father   . Cataracts Father   . Hypertension Paternal Uncle   . Heart disease Paternal Uncle 50       MI/CABG x 4- died after failed bipass  . Sleep apnea Brother   . Anemia Sister        hyperglycemia  . Sickle cell trait Sister   . Goiter Sister     Allergies  Allergen Reactions  . Asa [Aspirin] Other (See Comments)    gastriitis  . Belviq [Lorcaserin Hcl] Other (See Comments)    Dizziness, nausea    Current Outpatient Medications on File Prior to Visit  Medication Sig Dispense Refill  . buPROPion (WELLBUTRIN SR) 100 MG 12 hr tablet Take 1 tablet by mouth daily.    . cetirizine (ZYRTEC) 10 MG tablet Take 10 mg by mouth daily as needed for allergies.    Marland Kitchen. docusate sodium (COLACE) 100 MG capsule Take 100 mg by mouth as needed for mild constipation.    Marland Kitchen. omeprazole (PRILOSEC) 20 MG capsule Take 1 capsule (20 mg total) by mouth daily. 30 capsule 5   No current facility-administered medications on file prior to visit.     BP 109/80   Pulse 71    Observations/Objective:   Gen: Awake, alert, no acute distress Resp: Breathing is even and non-labored Psych: calm/pleasant demeanor Neuro: Alert and Oriented x 3, + facial symmetry, speech is clear.   Assessment and Plan:  Depression-currently stable on Wellbutrin.  I advised her to discuss potential medication change given her previous issue with itching on Wellbutrin.  History of elevated prolactin level-we will check follow-up prolactin level.  GERD-stable on proton pump inhibitor continue same as needed  Hyperglycemia-will obtain follow-up A1c.  History of thyroid nodule-negative biopsy.  Obtain follow-up thyroid function test.  I suggested she also follow-up with her endocrinologist to determine the need for ongoing  ultrasound surveillance.  Vitamin D deficiency-will obtain follow-up vitamin D level.  Follow Up Instructions:    I discussed the assessment and treatment plan with the patient. The patient was provided an opportunity to ask questions and all were answered. The patient agreed with the plan and demonstrated an understanding of the instructions.   The patient was advised to call back or seek an in-person evaluation if the symptoms worsen or if the condition fails to improve  as anticipated.  Lemont Fillers, NP

## 2018-11-12 ENCOUNTER — Other Ambulatory Visit (INDEPENDENT_AMBULATORY_CARE_PROVIDER_SITE_OTHER): Payer: 59

## 2018-11-12 DIAGNOSIS — E041 Nontoxic single thyroid nodule: Secondary | ICD-10-CM | POA: Diagnosis not present

## 2018-11-12 DIAGNOSIS — E229 Hyperfunction of pituitary gland, unspecified: Secondary | ICD-10-CM | POA: Diagnosis not present

## 2018-11-12 DIAGNOSIS — E559 Vitamin D deficiency, unspecified: Secondary | ICD-10-CM | POA: Diagnosis not present

## 2018-11-12 DIAGNOSIS — R739 Hyperglycemia, unspecified: Secondary | ICD-10-CM

## 2018-11-12 LAB — T3, FREE: T3, Free: 3.2 pg/mL (ref 2.3–4.2)

## 2018-11-12 LAB — HEMOGLOBIN A1C: Hgb A1c MFr Bld: 6.4 % (ref 4.6–6.5)

## 2018-11-12 LAB — TSH: TSH: 1 u[IU]/mL (ref 0.35–4.50)

## 2018-11-12 LAB — T4, FREE: Free T4: 0.89 ng/dL (ref 0.60–1.60)

## 2018-11-13 ENCOUNTER — Encounter: Payer: Self-pay | Admitting: Family

## 2018-11-15 ENCOUNTER — Encounter: Payer: Self-pay | Admitting: Family

## 2018-11-15 DIAGNOSIS — E669 Obesity, unspecified: Secondary | ICD-10-CM

## 2018-11-15 LAB — VITAMIN D 1,25 DIHYDROXY
Vitamin D 1, 25 (OH)2 Total: 63 pg/mL (ref 18–72)
Vitamin D2 1, 25 (OH)2: <8 pg/mL
Vitamin D3 1, 25 (OH)2: 63 pg/mL

## 2018-11-15 LAB — PROLACTIN: Prolactin: 13.5 ng/mL

## 2018-11-20 ENCOUNTER — Encounter: Payer: Self-pay | Admitting: Family

## 2018-11-21 DIAGNOSIS — F331 Major depressive disorder, recurrent, moderate: Secondary | ICD-10-CM | POA: Diagnosis not present

## 2019-02-13 DIAGNOSIS — F331 Major depressive disorder, recurrent, moderate: Secondary | ICD-10-CM | POA: Diagnosis not present

## 2019-02-17 DIAGNOSIS — M278 Other specified diseases of jaws: Secondary | ICD-10-CM | POA: Diagnosis not present

## 2019-04-25 ENCOUNTER — Encounter: Payer: Self-pay | Admitting: Obstetrics and Gynecology

## 2019-05-08 ENCOUNTER — Encounter: Payer: Self-pay | Admitting: Family

## 2019-06-01 IMAGING — US US THYROID
1 series · 13 of 25 positions shown · non-contrast
Comparison: 09/06/2017, 08/23/2017

CLINICAL DATA: Thyroid nodules, previous right thyroid biopsy
09/06/2017

EXAM:
THYROID ULTRASOUND
TECHNIQUE: Ultrasound examination of the thyroid gland and adjacent soft
tissues was performed.

[Series 1: us thyroid · 0.05mm/px · 13 of 42 slices shown]
[im 1/42]
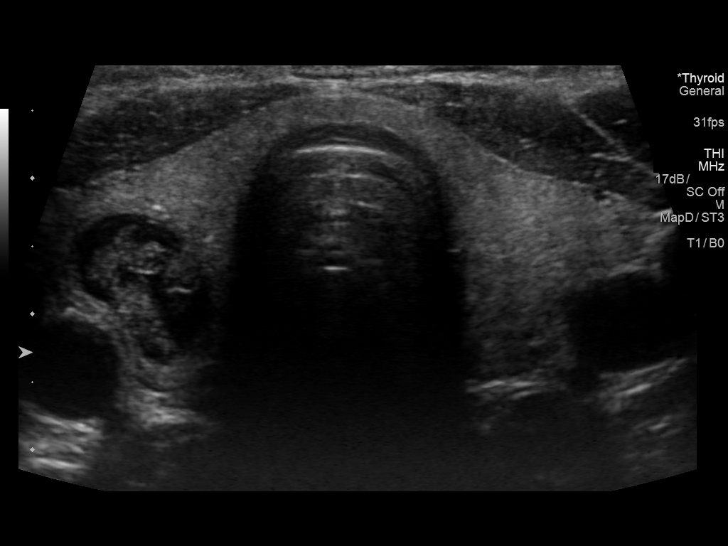
[im 4/42]
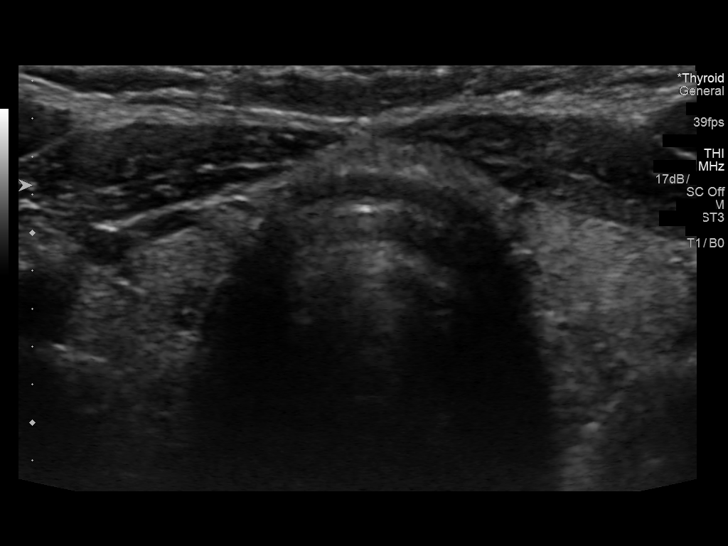
[im 7/42]
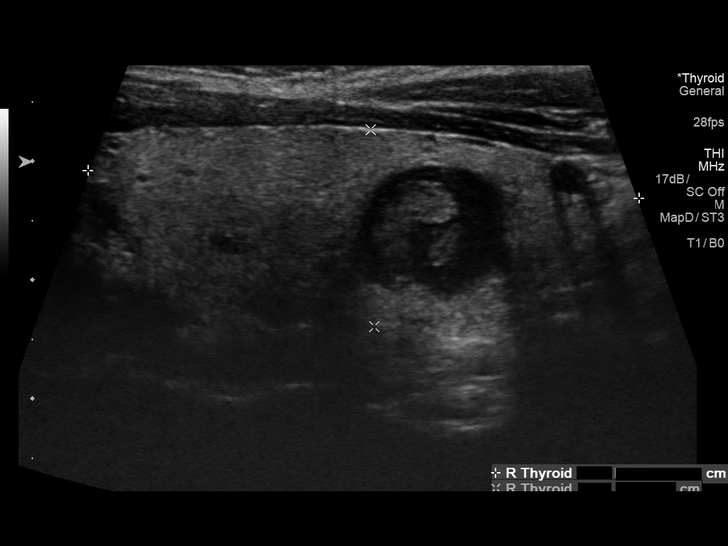
[im 11/42]
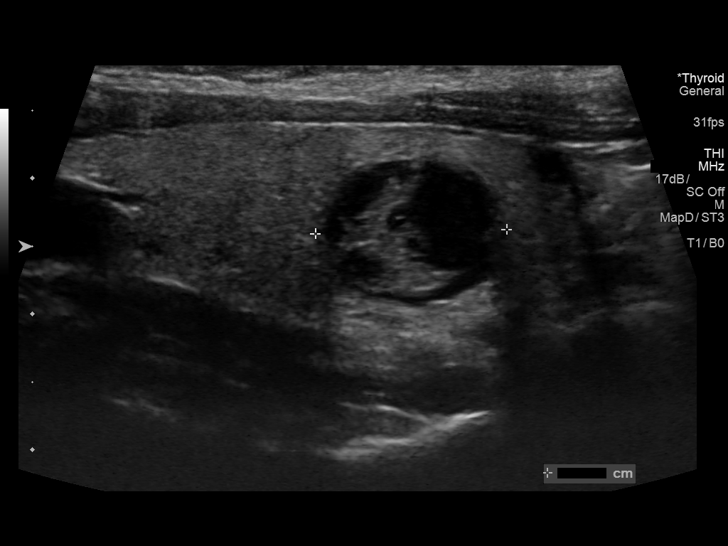
[im 14/42]
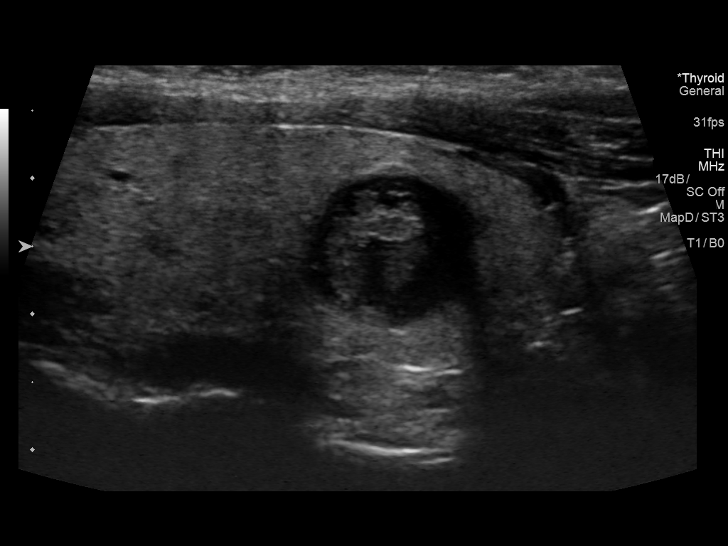
[im 18/42]
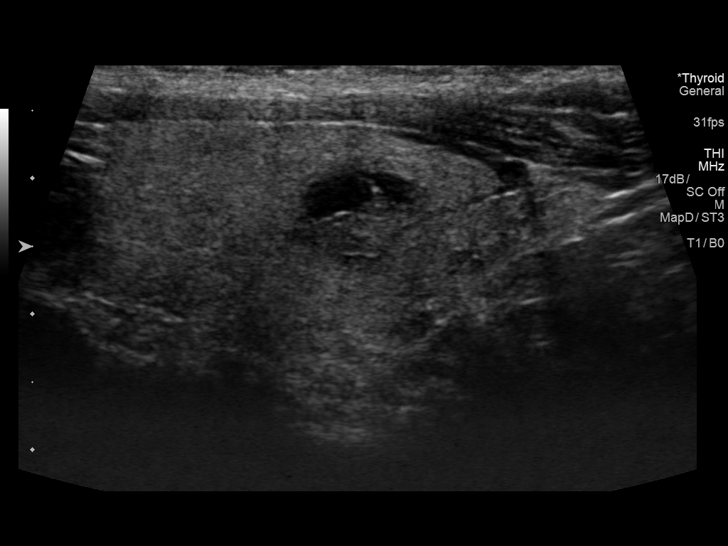
[im 21/42]
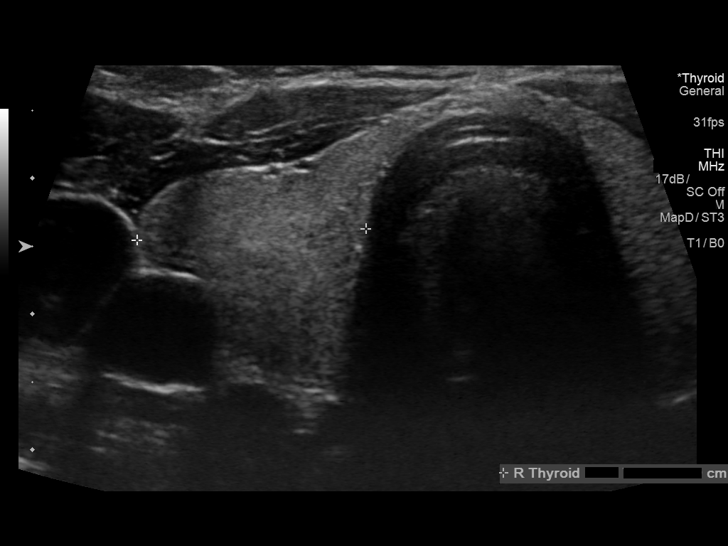
[im 24/42]
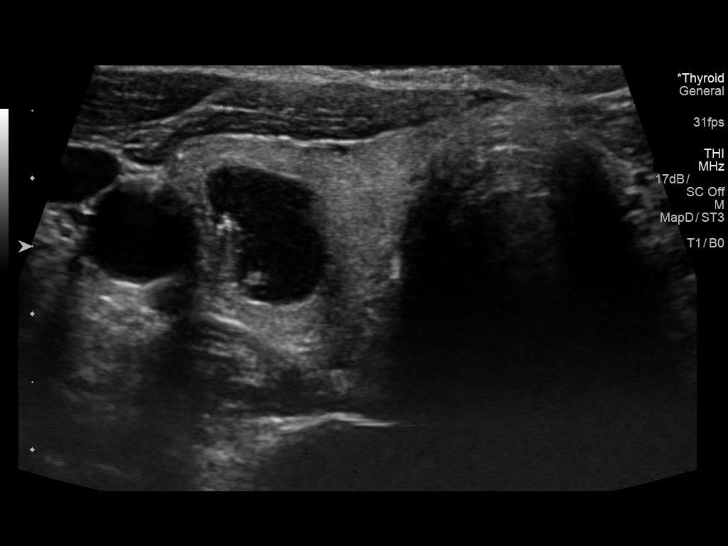
[im 28/42]
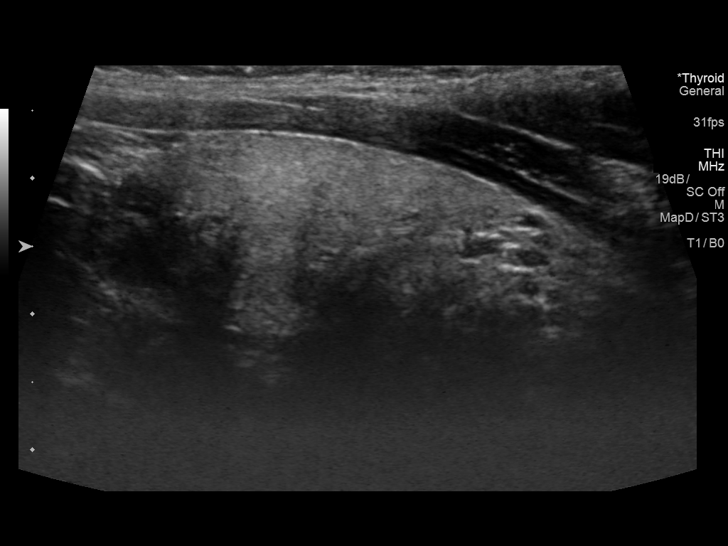
[im 31/42]
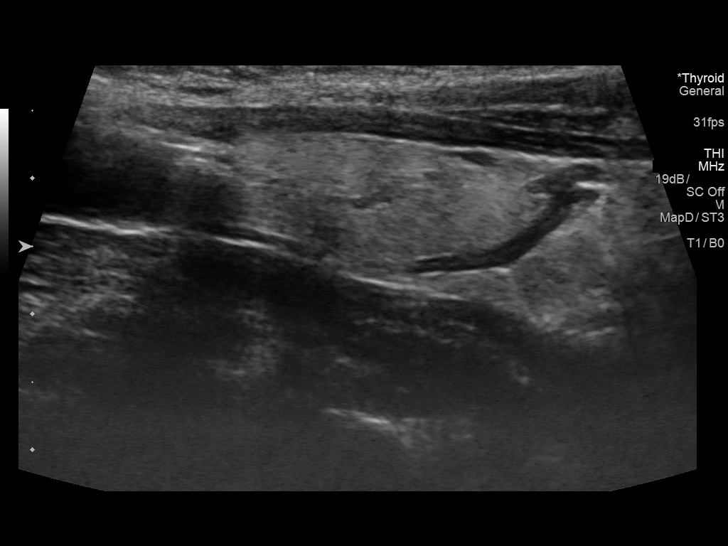
[im 35/42]
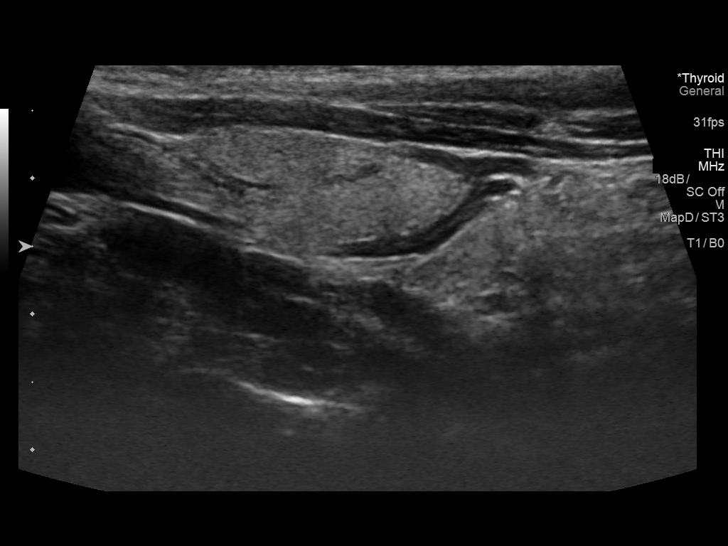
[im 38/42]
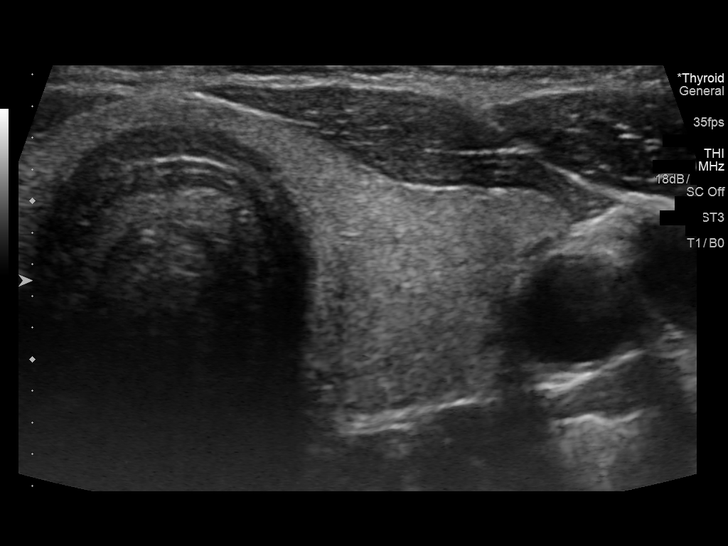
[im 42/42]
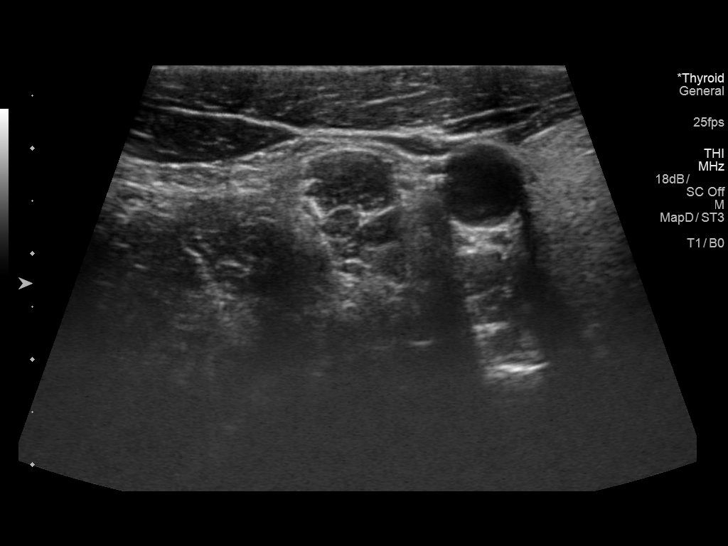

[13 of 25 positions shown; findings below may reference images not displayed]

FINDINGS: Parenchymal Echotexture: Normal

Isthmus: 2 mm

Right lobe: 4.7 x 1.7 x 1.7 cm, previously 4.6 x 2.1 x 2.1 cm

Left lobe: 4.3 x 1.8 x 1.7 cm, previously 4.7 x 1.3 x 1.5 cm

_________________________________________________________

Estimated total number of nodules >/= 1 cm: 2

Number of spongiform nodules >/=  2 cm not described below (TR1): 0

Number of mixed cystic and solid nodules >/= 1.5 cm not described
below (TR2): 0

_________________________________________________________

The previously biopsied right inferior thyroid mixed cystic/solid
nodule is smaller than before measuring 1.4 x 1.2 x 1.0 cm,
previously 2.2 x 2.0 x 1.7 cm. Correlate with prior pathology.

The left inferior 10 mm TR 2 nodules also unchanged and does not
meet criteria for any biopsy or follow-up.

No new thyroid nodule.  Normal vascularity.  No regional adenopathy.
IMPRESSION: Previously biopsied right inferior thyroid mixed cystic/solid nodule
is smaller. See measurements as above. Correlate with prior
pathology.

10 mm left inferior TR 2 nodule is unchanged. This does not meet
criteria for any biopsy or follow-up.

No new abnormality.

The above is in keeping with the ACR TI-RADS recommendations - [HOSPITAL] 6875;[DATE].

## 2019-07-23 ENCOUNTER — Ambulatory Visit (INDEPENDENT_AMBULATORY_CARE_PROVIDER_SITE_OTHER): Payer: 59 | Admitting: Family

## 2019-07-23 ENCOUNTER — Encounter: Payer: Self-pay | Admitting: Family

## 2019-07-23 ENCOUNTER — Other Ambulatory Visit: Payer: Self-pay

## 2019-07-23 ENCOUNTER — Encounter: Payer: 59 | Admitting: Family

## 2019-07-23 VITALS — BP 105/72 | HR 67 | Temp 97.6°F | Resp 16 | Wt 175.0 lb

## 2019-07-23 DIAGNOSIS — R739 Hyperglycemia, unspecified: Secondary | ICD-10-CM

## 2019-07-23 DIAGNOSIS — Z0000 Encounter for general adult medical examination without abnormal findings: Secondary | ICD-10-CM

## 2019-07-23 LAB — BASIC METABOLIC PANEL
BUN: 8 mg/dL (ref 6–23)
CO2: 28 mEq/L (ref 19–32)
Calcium: 8.9 mg/dL (ref 8.4–10.5)
Chloride: 104 mEq/L (ref 96–112)
Creatinine, Ser: 0.84 mg/dL (ref 0.40–1.20)
GFR: 91.16 mL/min (ref >60.00)
Glucose, Bld: 82 mg/dL (ref 70–99)
Potassium: 4.4 mEq/L (ref 3.5–5.1)
Sodium: 136 mEq/L (ref 135–145)

## 2019-07-23 LAB — HEPATIC FUNCTION PANEL
ALT: 9 U/L (ref 0–35)
AST: 14 U/L (ref 0–37)
Albumin: 3.9 g/dL (ref 3.5–5.2)
Alkaline Phosphatase: 55 U/L (ref 39–117)
Bilirubin, Direct: 0.1 mg/dL (ref 0.0–0.3)
Total Bilirubin: 0.5 mg/dL (ref 0.2–1.2)
Total Protein: 6.5 g/dL (ref 6.0–8.3)

## 2019-07-23 LAB — CBC WITH DIFFERENTIAL/PLATELET
Basophils Absolute: 0 10*3/uL (ref 0.0–0.1)
Basophils Relative: 0.8 % (ref 0.0–3.0)
Eosinophils Absolute: 0.1 10*3/uL (ref 0.0–0.7)
Eosinophils Relative: 1.2 % (ref 0.0–5.0)
HCT: 38.1 % (ref 36.0–46.0)
Hemoglobin: 12.4 g/dL (ref 12.0–15.0)
Lymphocytes Relative: 43.4 % (ref 12.0–46.0)
Lymphs Abs: 2.6 10*3/uL (ref 0.7–4.0)
MCHC: 32.6 g/dL (ref 30.0–36.0)
MCV: 84.5 fl (ref 78.0–100.0)
Monocytes Absolute: 0.4 10*3/uL (ref 0.1–1.0)
Monocytes Relative: 6.5 % (ref 3.0–12.0)
Neutro Abs: 2.9 10*3/uL (ref 1.4–7.7)
Neutrophils Relative %: 48.1 % (ref 43.0–77.0)
Platelets: 292 10*3/uL (ref 150.0–400.0)
RBC: 4.5 Mil/uL (ref 3.87–5.11)
RDW: 13.5 % (ref 11.5–15.5)
WBC: 6 10*3/uL (ref 4.0–10.5)

## 2019-07-23 LAB — TSH: TSH: 0.74 u[IU]/mL (ref 0.35–4.50)

## 2019-07-23 LAB — LIPID PANEL
Cholesterol: 158 mg/dL (ref 0–200)
HDL: 48.5 mg/dL (ref >39.00)
LDL Cholesterol: 98 mg/dL (ref 0–99)
NonHDL: 109.68
Total CHOL/HDL Ratio: 3
Triglycerides: 57 mg/dL (ref 0.0–149.0)
VLDL: 11.4 mg/dL (ref 0.0–40.0)

## 2019-07-23 LAB — HEMOGLOBIN A1C: Hgb A1c MFr Bld: 6.1 % (ref 4.6–6.5)

## 2019-07-23 NOTE — Patient Instructions (Signed)
Keep up the great work with health diet, exercise and weight loss.

## 2019-07-23 NOTE — Progress Notes (Signed)
Subjective:    Patient ID: Sara Finley, female    DOB: [birth year], 39 y.o.   MRN: [id number]  HPI  Patient presents today for complete physical.  Immunizations:  Completed pfizer series.  Tdap up to date.  Diet: working on her diet (has cut out bread, eating smaller servings of rice), gave up alcohol Wt Readings from Last 3 Encounters:  07/23/19 175 lb (79.4 kg)  10/10/18 185 lb (83.9 kg)  03/15/18 180 lb (81.6 kg)  Exercise: some walking, zumba Pap Smear:  Due 09/05/19- she will schedule with GYN Dental: up to date Vision: early 2021  Thyroid nodule- She has not returned due to insurance.   Lab Results  Component Value Date   HGBA1C 6.4 11/12/2018    Review of Systems  Constitutional: Negative for unexpected weight change.  HENT: Negative for rhinorrhea.   Eyes: Negative for visual disturbance.  Respiratory: Negative for cough and shortness of breath.   Cardiovascular: Negative for chest pain.  Gastrointestinal: Negative for constipation and diarrhea.  Genitourinary: Negative for dysuria, frequency and hematuria.  Psychiatric/Behavioral:       No depression   Past Medical History:  Diagnosis Date  . Abnormal Pap smear of cervix 2010   + HPV. normal pap since   . GERD (gastroesophageal reflux disease)   . Hyperglycemia   . Prediabetes   . Stress incontinence      Social History   Socioeconomic History  . Marital status: Married    Spouse name: Not on file  . Number of children: Not on file  . Years of education: Not on file  . Highest education level: Not on file  Occupational History  . Not on file  Tobacco Use  . Smoking status: Never Smoker  . Smokeless tobacco: Never Used  Substance and Sexual Activity  . Alcohol use: Yes    Alcohol/week: 0.0 standard drinks  . Drug use: No  . Sexual activity: Not Currently    Birth control/protection: Abstinence  Other Topics Concern  . 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 Difficulty of Paying Living Expenses:   Food Insecurity:   . Worried About Charity fundraiser in the Last Year:   . Arboriculturist in the Last Year:   Transportation Needs:   . Film/video editor (Medical):   Marland Kitchen Lack of Transportation (Non-Medical):   Physical Activity:   . Days of Exercise per Week:   . Minutes of Exercise per Session:   Stress:   . Feeling of Stress :   Social Connections:   . Frequency of Communication with Friends and Family:   . Frequency of Social Gatherings with Friends and Family:   . Attends Religious Services:   . Active Member of Clubs or Organizations:   . Attends Archivist Meetings:   Marland Kitchen Marital Status:   Intimate Partner Violence:   . Fear of Current or Ex-Partner:   . Emotionally Abused:   Marland Kitchen Physically Abused:   . Sexually Abused:     Past Surgical History:  Procedure Laterality Date  . APPENDECTOMY    . BREAST ENHANCEMENT SURGERY    .  BREAST ENHANCEMENT SURGERY  09/2018   Removal   . BREAST IMPLANT REMOVAL  10/2018  . COLPOSCOPY  ~2010   Normal  . UMBILICAL HERNIA REPAIR  @ age 60    Family History  Problem Relation Age of Onset  . Hypertension Mother   . Hyperlipidemia Mother   . Diabetes Mellitus II Mother   . Hypertension Paternal Grandmother   . Stroke Paternal Grandmother   . Hypertension Father   . Hyperlipidemia Father   . Diabetes Father        ?pre-diabetes  . Benign prostatic hyperplasia Father   . Sleep apnea Father   . Asthma Father   . Cataracts Father   . Hypertension Paternal Uncle   . Heart disease Paternal Uncle 50       MI/CABG x 4- died after failed bipass  . Sleep apnea Brother   . Anemia Sister        hyperglycemia  . Sickle  cell trait Sister   . Goiter Sister     Allergies  Allergen Reactions  . Asa [Aspirin] Other (See Comments)    gastriitis  . Belviq [Lorcaserin Hcl] Other (See Comments)    Dizziness, nausea    Current Outpatient Medications on File Prior to Visit  Medication Sig Dispense Refill  . Multiple Vitamin (MULTIVITAMIN WITH MINERALS) TABS tablet Take 1 tablet by mouth daily.     No current facility-administered medications on file prior to visit.    BP 105/72 (BP Location: Right Arm, Patient Position: Sitting, Cuff Size: Small)   Pulse 67   Temp 97.6 F (36.4 C) (Temporal)   Resp 16   Wt 175 lb (79.4 kg)   SpO2 100%   BMI 31.00 kg/m       Objective:   Physical Exam Neck:     Thyroid: No thyroid mass.     Comments: No significant thyromegaly    Physical Exam  Constitutional: She is oriented to person, place, and time. She appears well-developed and well-nourished. No distress.  HENT:  Head: Normocephalic and atraumatic.  Right Ear: Tympanic membrane and ear canal normal.  Left Ear: Tympanic membrane and ear canal normal.  Mouth/Throat: Not examined, pt wearing mask Eyes: Pupils are equal, round, and reactive to light. No scleral icterus.  Neck: Normal range of motion. No thyromegaly present.  Cardiovascular: Normal rate and regular rhythm.   No murmur heard. Pulmonary/Chest: Effort normal and breath sounds normal. No respiratory distress. He has no wheezes. She has no rales. She exhibits no tenderness.  Abdominal: Soft. Bowel sounds are normal. She exhibits no distension and no mass. There is no tenderness. There is no rebound and no guarding.  Musculoskeletal: She exhibits no edema.  Lymphadenopathy:    She has no cervical adenopathy.  Neurological: She is alert and oriented to person, place, and time. She has normal patellar reflexes. She exhibits normal muscle tone. Coordination normal.  Skin: Skin is warm and dry.  Psychiatric: She has a normal mood and affect.  Her behavior is normal. Judgment and thought content normal.  Breast/pelvic: deferred           Assessment & Plan:   Preventative care-I commended her on her recent weight loss.  She will schedule Pap smear with gynecology.  Immunizations reviewed and up-to-date.  Obtain routine lab work.   This visit occurred during the SARS-CoV-2 public health emergency.  Safety protocols were in place, including screening questions prior to the visit, additional usage of staff PPE, and  extensive cleaning of exam room while observing appropriate contact time as indicated for disinfecting solutions.        Assessment & Plan:

## 2019-11-27 ENCOUNTER — Encounter: Payer: Self-pay | Admitting: Obstetrics and Gynecology

## 2019-11-27 ENCOUNTER — Other Ambulatory Visit: Payer: Self-pay

## 2019-11-27 ENCOUNTER — Ambulatory Visit (INDEPENDENT_AMBULATORY_CARE_PROVIDER_SITE_OTHER): Payer: 59 | Admitting: Obstetrics and Gynecology

## 2019-11-27 VITALS — BP 100/62 | HR 95 | Ht 67.0 in | Wt 187.0 lb

## 2019-11-27 DIAGNOSIS — N3946 Mixed incontinence: Secondary | ICD-10-CM | POA: Diagnosis not present

## 2019-11-27 DIAGNOSIS — Z01419 Encounter for gynecological examination (general) (routine) without abnormal findings: Secondary | ICD-10-CM

## 2019-11-27 DIAGNOSIS — N898 Other specified noninflammatory disorders of vagina: Secondary | ICD-10-CM | POA: Diagnosis not present

## 2019-11-27 DIAGNOSIS — Z23 Encounter for immunization: Secondary | ICD-10-CM

## 2019-11-27 DIAGNOSIS — R102 Pelvic and perineal pain: Secondary | ICD-10-CM | POA: Diagnosis not present

## 2019-11-27 NOTE — Patient Instructions (Addendum)
EXERCISE AND DIET:  We recommended that you start or continue a regular exercise program for good health. Regular exercise means any activity that makes your heart beat faster and makes you sweat.  We recommend exercising at least 30 minutes per day at least 3 days a week, preferably 4 or 5.  We also recommend a diet low in fat and sugar.  Inactivity, poor dietary choices and obesity can cause diabetes, heart attack, stroke, and kidney damage, among others.    ALCOHOL AND SMOKING:  Women should limit their alcohol intake to no more than 7 drinks/beers/glasses of wine (combined, not each!) per week. Moderation of alcohol intake to this level decreases your risk of breast cancer and liver damage. And of course, no recreational drugs are part of a healthy lifestyle.  And absolutely no smoking or even second hand smoke. Most people know smoking can cause heart and lung diseases, but did you know it also contributes to weakening of your bones? Aging of your skin?  Yellowing of your teeth and nails?  CALCIUM AND VITAMIN D:  Adequate intake of calcium and Vitamin D are recommended.  The recommendations for exact amounts of these supplements seem to change often, but generally speaking 1,000 mg of calcium (between diet and supplement) and 800 units of Vitamin D per day seems prudent. Certain women may benefit from higher intake of Vitamin D.  If you are among these women, your doctor will have told you during your visit.    PAP SMEARS:  Pap smears, to check for cervical cancer or precancers,  have traditionally been done yearly, although recent scientific advances have shown that most women can have pap smears less often.  However, every woman still should have a physical exam from her gynecologist every year. It will include a breast check, inspection of the vulva and vagina to check for abnormal growths or skin changes, a visual exam of the cervix, and then an exam to evaluate the size and shape of the uterus and  ovaries.  And after 40 years of age, a rectal exam is indicated to check for rectal cancers. We will also provide age appropriate advice regarding health maintenance, like when you should have certain vaccines, screening for sexually transmitted diseases, bone density testing, colonoscopy, mammograms, etc.   MAMMOGRAMS:  All women over 40 years old should have a yearly mammogram. Many facilities now offer a "3D" mammogram, which may cost around $50 extra out of pocket. If possible,  we recommend you accept the option to have the 3D mammogram performed.  It both reduces the number of women who will be called back for extra views which then turn out to be normal, and it is better than the routine mammogram at detecting truly abnormal areas.    COLON CANCER SCREENING: Now recommend starting at age 45. At this time colonoscopy is not covered for routine screening until 50. There are take home tests that can be done between 45-49.   COLONOSCOPY:  Colonoscopy to screen for colon cancer is recommended for all women at age 50.  We know, you hate the idea of the prep.  We agree, BUT, having colon cancer and not knowing it is worse!!  Colon cancer so often starts as a polyp that can be seen and removed at colonscopy, which can quite literally save your life!  And if your first colonoscopy is normal and you have no family history of colon cancer, most women don't have to have it again for   10 years.  Once every ten years, you can do something that may end up saving your life, right?  We will be happy to help you get it scheduled when you are ready.  Be sure to check your insurance coverage so you understand how much it will cost.  It may be covered as a preventative service at no cost, but you should check your particular policy.      Breast Self-Awareness Breast self-awareness means being familiar with how your breasts look and feel. It involves checking your breasts regularly and reporting any changes to your  health care provider. Practicing breast self-awareness is important. A change in your breasts can be a sign of a serious medical problem. Being familiar with how your breasts look and feel allows you to find any problems early, when treatment is more likely to be successful. All women should practice breast self-awareness, including women who have had breast implants. How to do a breast self-exam One way to learn what is normal for your breasts and whether your breasts are changing is to do a breast self-exam. To do a breast self-exam: Look for Changes  1. Remove all the clothing above your waist. 2. Stand in front of a mirror in a room with good lighting. 3. Put your hands on your hips. 4. Push your hands firmly downward. 5. Compare your breasts in the mirror. Look for differences between them (asymmetry), such as: ? Differences in shape. ? Differences in size. ? Puckers, dips, and bumps in one breast and not the other. 6. Look at each breast for changes in your skin, such as: ? Redness. ? Scaly areas. 7. Look for changes in your nipples, such as: ? Discharge. ? Bleeding. ? Dimpling. ? Redness. ? A change in position. Feel for Changes Carefully feel your breasts for lumps and changes. It is best to do this while lying on your back on the floor and again while sitting or standing in the shower or tub with soapy water on your skin. Feel each breast in the following way:  Place the arm on the side of the breast you are examining above your head.  Feel your breast with the other hand.  Start in the nipple area and make  inch (2 cm) overlapping circles to feel your breast. Use the pads of your three middle fingers to do this. Apply light pressure, then medium pressure, then firm pressure. The light pressure will allow you to feel the tissue closest to the skin. The medium pressure will allow you to feel the tissue that is a little deeper. The firm pressure will allow you to feel the tissue  close to the ribs.  Continue the overlapping circles, moving downward over the breast until you feel your ribs below your breast.  Move one finger-width toward the center of the body. Continue to use the  inch (2 cm) overlapping circles to feel your breast as you move slowly up toward your collarbone.  Continue the up and down exam using all three pressures until you reach your armpit.  Write Down What You Find  Write down what is normal for each breast and any changes that you find. Keep a written record with breast changes or normal findings for each breast. By writing this information down, you do not need to depend only on memory for size, tenderness, or location. Write down where you are in your menstrual cycle, if you are still menstruating. If you are having trouble noticing differences   in your breasts, do not get discouraged. With time you will become more familiar with the variations in your breasts and more comfortable with the exam. How often should I examine my breasts? Examine your breasts every month. If you are breastfeeding, the best time to examine your breasts is after a feeding or after using a breast pump. If you menstruate, the best time to examine your breasts is 5-7 days after your period is over. During your period, your breasts are lumpier, and it may be more difficult to notice changes. When should I see my health care provider? See your health care provider if you notice:  A change in shape or size of your breasts or nipples.  A change in the skin of your breast or nipples, such as a reddened or scaly area.  Unusual discharge from your nipples.  A lump or thick area that was not there before.  Pain in your breasts.  Anything that concerns you.  Urinary Incontinence  Urinary incontinence refers to a condition in which a person is unable to control where and when to pass urine. A person with this condition will urinate when he or she does not mean to  (involuntarily). What are the causes? This condition may be caused by:  Medicines.  Infections.  Constipation.  Overactive bladder muscles.  Weak bladder muscles.  Weak pelvic floor muscles. These muscles provide support for the bladder, intestine, and, in women, the uterus.  Enlarged prostate in men. The prostate is a gland near the bladder. When it gets too big, it can pinch the urethra. With the urethra blocked, the bladder can weaken and lose the ability to empty properly.  Surgery.  Emotional factors, such as anxiety, stress, or post-traumatic stress disorder (PTSD).  Pelvic organ prolapse. This happens in women when organs shift out of place and into the vagina. This shift can prevent the bladder and urethra from working properly. What increases the risk? The following factors may make you more likely to develop this condition:  Older age.  Obesity and physical inactivity.  Pregnancy and childbirth.  Menopause.  Diseases that affect the nerves or spinal cord (neurological diseases).  Long-term (chronic) coughing. This can increase pressure on the bladder and pelvic floor muscles. What are the signs or symptoms? Symptoms may vary depending on the type of urinary incontinence you have. They include:  A sudden urge to urinate, but passing urine involuntarily before you can get to a bathroom (urge incontinence).  Suddenly passing urine with any activity that forces urine to pass, such as coughing, laughing, exercise, or sneezing (stress incontinence).  Needing to urinate often, but urinating only a small amount, or constantly dribbling urine (overflow incontinence).  Urinating because you cannot get to the bathroom in time due to a physical disability, such as arthritis or injury, or communication and thinking problems, such as Alzheimer disease (functional incontinence). How is this diagnosed? This condition may be diagnosed based on:  Your medical history.  A  physical exam.  Tests, such as: ? Urine tests. ? X-rays of your kidney and bladder. ? Ultrasound. ? CT scan. ? Cystoscopy. In this procedure, a health care provider inserts a tube with a light and camera (cystoscope) through the urethra and into the bladder in order to check for problems. ? Urodynamic testing. These tests assess how well the bladder, urethra, and sphincter can store and release urine. There are different types of urodynamic tests, and they vary depending on what the test is measuring.   To help diagnose your condition, your health care provider may recommend that you keep a log of when you urinate and how much you urinate. How is this treated? Treatment for this condition depends on the type of incontinence that you have and its cause. Treatment may include:  Lifestyle changes, such as: ? Quitting smoking. ? Maintaining a healthy weight. ? Staying active. Try to get 150 minutes of moderate-intensity exercise every week. Ask your health care provider which activities are safe for you. ? Eating a healthy diet.  Avoid high-fat foods, like fried foods.  Avoid refined carbohydrates like white bread and white rice.  Limit how much alcohol and caffeine you drink.  Increase your fiber intake. Foods such as fresh fruits, vegetables, beans, and whole grains are healthy sources of fiber.  Pelvic floor muscle exercises.  Bladder training, such as lengthening the amount of time between bathroom breaks, or using the bathroom at regular intervals.  Using techniques to suppress bladder urges. This can include distraction techniques or controlled breathing exercises.  Medicines to relax the bladder muscles and prevent bladder spasms.  Medicines to help slow or prevent the growth of a man's prostate.  Botox injections. These can help relax the bladder muscles.  Using pulses of electricity to help change bladder reflexes (electrical nerve stimulation).  For women, using a medical  device to prevent urine leaks. This is a small, tampon-like, disposable device that is inserted into the urethra.  Injecting collagen or carbon beads (bulking agents) into the urinary sphincter. These can help thicken tissue and close the bladder opening.  Surgery. Follow these instructions at home: Lifestyle  Limit alcohol and caffeine. These can fill your bladder quickly and irritate it.  Keep yourself clean to help prevent odors and skin damage. Ask your doctor about special skin creams and cleansers that can protect the skin from urine.  Consider wearing pads or adult diapers. Make sure to change them regularly, and always change them right after experiencing incontinence. General instructions  Take over-the-counter and prescription medicines only as told by your health care provider.  Use the bathroom about every 3-4 hours, even if you do not feel the need to urinate. Try to empty your bladder completely every time. After urinating, wait a minute. Then try to urinate again.  Make sure you are in a relaxed position while urinating.  If your incontinence is caused by nerve problems, keep a log of the medicines you take and the times you go to the bathroom.  Keep all follow-up visits as told by your health care provider. This is important. Contact a health care provider if:  You have pain that gets worse.  Your incontinence gets worse. Get help right away if:  You have a fever or chills.  You are unable to urinate.  You have redness in your groin area or down your legs. Summary  Urinary incontinence refers to a condition in which a person is unable to control where and when to pass urine.  This condition may be caused by medicines, infection, weak bladder muscles, weak pelvic floor muscles, enlargement of the prostate (in men), or surgery.  The following factors increase your risk for developing this condition: older age, obesity, pregnancy and childbirth, menopause,  neurological diseases, and chronic coughing.  There are several types of urinary incontinence. They include urge incontinence, stress incontinence, overflow incontinence, and functional incontinence.  This condition is usually treated first with lifestyle and behavioral changes, such as quitting smoking, eating a healthier   diet, and doing regular pelvic floor exercises. Other treatment options include medicines, bulking agents, medical devices, electrical nerve stimulation, or surgery. This information is not intended to replace advice given to you by your health care provider. Make sure you discuss any questions you have with your health care provider. Document Revised: 02/23/2017 Document Reviewed: 05/25/2016 Elsevier Patient Education  2020 Elsevier Inc.   Kegel Exercises  Kegel exercises can help strengthen your pelvic floor muscles. The pelvic floor is a group of muscles that support your rectum, small intestine, and bladder. In females, pelvic floor muscles also help support the womb (uterus). These muscles help you control the flow of urine and stool. Kegel exercises are painless and simple, and they do not require any equipment. Your provider may suggest Kegel exercises to:  Improve bladder and bowel control.  Improve sexual response.  Improve weak pelvic floor muscles after surgery to remove the uterus (hysterectomy) or pregnancy (females).  Improve weak pelvic floor muscles after prostate gland removal or surgery (males). Kegel exercises involve squeezing your pelvic floor muscles, which are the same muscles you squeeze when you try to stop the flow of urine or keep from passing gas. The exercises can be done while sitting, standing, or lying down, but it is best to vary your position. Exercises How to do Kegel exercises: 1. Squeeze your pelvic floor muscles tight. You should feel a tight lift in your rectal area. If you are a female, you should also feel a tightness in your  vaginal area. Keep your stomach, buttocks, and legs relaxed. 2. Hold the muscles tight for up to 10 seconds. 3. Breathe normally. 4. Relax your muscles. 5. Repeat as told by your health care provider. Repeat this exercise daily as told by your health care provider. Continue to do this exercise for at least 4-6 weeks, or for as long as told by your health care provider. You may be referred to a physical therapist who can help you learn more about how to do Kegel exercises. Depending on your condition, your health care provider may recommend:  Varying how long you squeeze your muscles.  Doing several sets of exercises every day.  Doing exercises for several weeks.  Making Kegel exercises a part of your regular exercise routine. This information is not intended to replace advice given to you by your health care provider. Make sure you discuss any questions you have with your health care provider. Document Revised: 10/03/2017 Document Reviewed: 10/03/2017 Elsevier Patient Education  2020 Elsevier Inc.  

## 2019-11-27 NOTE — Progress Notes (Addendum)
39 y.o. [id number] Divorced Black or African American Not Hispanic or Latino female here for annual exam.   Her last two periods were very painful on the left side and very heavy. She had an odor prior to her cycle.   The last 2 cycles she she developed SP and LLQ pelvic pressure and pain, going down to her vagina a few days prior to her cycle, associated with slow urine flow. When her cycle came her symptoms resolved. Currently no pain. She had a similar pain a year ago that was more intense.   She notices a vaginal odor prior to her cycle and with her cycle for months.   She has OAB symptoms with coffee.  She has mixed incontinence, leaking small amounts daily. Has to wear a panty liner. Voids normal amounts. 2 cups of coffee q am.   Period Cycle (Days): 28 Period Duration (Days): 5-7 Period Pattern: Regular Menstrual Flow: Moderate Menstrual Control: Maxi pad Menstrual Control Change Freq (Hours): 4 Dysmenorrhea: (!) Moderate Dysmenorrhea Symptoms: Cramping  Patient's last menstrual period was 11/07/2019 (approximate).          Sexually active: No.  The current method of family planning is none.    Exercising: No.  The patient does not participate in regular exercise at present. Smoker:  no  Health Maintenance: Pap:  09/13/2017 normal, negative hpv History of abnormal Pap:  Yes +HPV, years ago, prior to her daughter. Normals after that.  MMG:  Never  BMD:   Never  Colonoscopy: N/a TDaP:  2018 Gardasil: no   reports that she has never smoked. She has never used smokeless tobacco. She reports current alcohol use. She reports that she does not use drugs. 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 She has a 48 year old daughter, Mom helps watch her daughter some. Moved here from [place] ([place] in 2003.  Divorced in the last year. She has her daughter full time.   Past Medical History:  Diagnosis Date  . Abnormal Pap smear of cervix 2010   + HPV. normal pap since   . GERD (gastroesophageal reflux  disease)   . Hyperglycemia   . Prediabetes   . Stress incontinence   . Thyroid nodule     Past Surgical History:  Procedure Laterality Date  . APPENDECTOMY    . BREAST ENHANCEMENT SURGERY    . BREAST ENHANCEMENT SURGERY  09/2018   Removal   . BREAST IMPLANT REMOVAL  10/2018  . COLPOSCOPY  ~2010   Normal  . UMBILICAL HERNIA REPAIR  @ age 55    Current Outpatient Medications  Medication Sig Dispense Refill  . Multiple Vitamin (MULTIVITAMIN WITH MINERALS) TABS tablet Take 1 tablet by mouth daily.     No current facility-administered medications for this visit.    Family History  Problem Relation Age of Onset  . Hypertension Mother   . Hyperlipidemia Mother   . Diabetes Mellitus II Mother   . Hypertension Paternal Grandmother   . Stroke Paternal Grandmother   . Hypertension Father   . Hyperlipidemia Father   . Diabetes Father        ?pre-diabetes  . Benign prostatic hyperplasia Father   . Sleep apnea Father   . Asthma Father   . Cataracts Father   . Hypertension Paternal Uncle   . Heart disease Paternal Uncle 50       MI/CABG x 4- died after failed bipass  . Sleep apnea Brother   . Anemia Sister  hyperglycemia  . Sickle cell trait Sister   . Goiter Sister     Review of Systems  Genitourinary: Positive for menstrual problem and vaginal pain.  All other systems reviewed and are negative.   Exam:   BP 100/62   Pulse 95   Ht 5\' 7"  (1.702 m)   Wt 187 lb (84.8 kg)   LMP 11/07/2019 (Approximate)   SpO2 98%   BMI 29.29 kg/m   Weight change: @WEIGHTCHANGE @ Height:   Height: 5\' 7"  (170.2 cm)  Ht Readings from Last 3 Encounters:  11/27/19 5\' 7"  (1.702 m)  10/10/18 5\' 3"  (1.6 m)  03/15/18 5\' 3"  (1.6 m)    General appearance: alert, cooperative and appears stated age Head: Normocephalic, without obvious abnormality, atraumatic Neck: no adenopathy, supple, symmetrical, trachea midline and thyroid normal to inspection and palpation Lungs: clear to  auscultation bilaterally Cardiovascular: regular rate and rhythm Breasts: normal appearance, no masses or tenderness Abdomen: soft, non-tender; non distended,  no masses,  no organomegaly Extremities: extremities normal, atraumatic, no cyanosis or edema Skin: Skin color, texture, turgor normal. No rashes or lesions Lymph nodes: Cervical, supraclavicular, and axillary nodes normal. No abnormal inguinal nodes palpated Neurologic: Grossly normal   Pelvic: External genitalia:  no lesions              Urethra:  normal appearing urethra with no masses, tenderness or lesions              Bartholins and Skenes: normal                 Vagina: normal appearing vagina with normal color and discharge, no lesions              Cervix: no lesions               Bimanual Exam:  Uterus:  normal size, contour, position, consistency, mobility, non-tender              Adnexa: no mass, fullness, tenderness               Rectovaginal: Confirms               Anus:  normal sphincter tone, no lesions  11/29/19 chaperoned for the exam.  A:  Well Woman with normal exam  Prediabetes  Pain prior to her last few cycles, normal exam. If pain recurs will set her up for a pelvic ultrasound  Vaginal odor with her cycle, negative vaginal slides, elevated pH  Mixed incontinence, urge>stress  P:   No pap this year  Mammogram after 40  Send nuswab vaginitis testing  Try to cut out caffeine  Kegel information given  Bladder training information given   If incontinence continues to be an issue will refer to PT  Start gardasil series    Addendum: last pap was on 08/28/16, not 09/13/2017 as listed above.  Her pap from 08/28/16 was WNL, negative hpv  She hasn't had an abnormal pap for years. Guidelines are pap and hpv q 5 years. Due in 7/23.

## 2019-11-30 LAB — NUSWAB VAGINITIS (VG)
Atopobium vaginae: HIGH Score — AB
Candida albicans, NAA: NEGATIVE
Candida glabrata, NAA: NEGATIVE
Trich vag by NAA: NEGATIVE

## 2019-12-01 ENCOUNTER — Other Ambulatory Visit: Payer: Self-pay | Admitting: *Deleted

## 2019-12-01 ENCOUNTER — Telehealth: Payer: Self-pay | Admitting: *Deleted

## 2019-12-01 MED ORDER — METRONIDAZOLE 0.75 % VA GEL
1.0000 | Freq: Every day | VAGINAL | 0 refills | Status: DC
Start: 2019-12-01 — End: 2019-12-01

## 2019-12-01 MED FILL — metroNIDAZOLE 0.75 % GEL: 0.75 | 5 days supply | Qty: 70 | Fill #0

## 2019-12-01 NOTE — Telephone Encounter (Signed)
-----   Message from Romualdo Bolk, MD sent at 12/01/2019 12:33 PM EDT ----- Please let the patient know that her nuswab is indeterminate for BV. Given her symptoms and elevated pH in the office, I would recommend treatment. Treat with flagyl (either oral or vaginal, her choice), no ETOH while on Flagyl.  Oral: Flagyl 500 mg BID x 7 days, or Vaginal: Metrogel, 1 applicator per vagina q day x 5 days.

## 2019-12-01 NOTE — Telephone Encounter (Signed)
Leda Min, RN  12/01/2019 4:14 PM EDT Back to Top    Spoke with patient, advised as seen below per Dr. Oscar La. Rx for metrogel to verified pharmacy, patient verbalizes understanding and is agreeable.   Patient has additional questions for provider, see 12/01/19 telephone encounter.      Patient asking for pap results. Advised per review of OV note, no pap this year.  Last pap 09/04/16, normal. Advised I will review with provider and f/u with recommendations, patient agreeable.

## 2019-12-01 NOTE — Telephone Encounter (Signed)
Reviewed Pap hx with Dr. Oscar La. Call returned to patient, last pap 09/04/16 WNL, neg hpv.  Current guidelines recommend Pap and hpv q5 yrs, due 08/2021.  Patient verbalizes understanding and is agreeable to plan.   Encounter closed.

## 2020-01-27 ENCOUNTER — Telehealth: Payer: Self-pay

## 2020-01-27 NOTE — Telephone Encounter (Signed)
Spoke with pt. Pt asking if can have 2nd and 3rd Gardasil vaccines at her work. She works at [employer] in [place]. Pt advised she can have vaccines there as long as they can order and administer there. Pt advised to return call if needs to come back here for nurse visit.  Pt aware of 1st injection of Gardasil 11/27/19 at [facility] with Dr Oscar La. 2nd injection due now.  Encounter closed.

## 2020-01-27 NOTE — Telephone Encounter (Signed)
Patient is calling to know if she can have her remaining guardasil shots done at PCP.

## 2020-01-30 ENCOUNTER — Ambulatory Visit: Payer: 59

## 2020-02-08 DIAGNOSIS — Z20828 Contact with and (suspected) exposure to other viral communicable diseases: Secondary | ICD-10-CM | POA: Diagnosis not present

## 2020-02-09 ENCOUNTER — Other Ambulatory Visit: Payer: 59

## 2020-02-09 DIAGNOSIS — Z20822 Contact with and (suspected) exposure to covid-19: Secondary | ICD-10-CM | POA: Diagnosis not present

## 2020-02-10 LAB — SARS-COV-2, NAA 2 DAY TAT

## 2020-02-10 LAB — NOVEL CORONAVIRUS, NAA: SARS-CoV-2, NAA: NOT DETECTED

## 2020-02-26 DIAGNOSIS — Z20828 Contact with and (suspected) exposure to other viral communicable diseases: Secondary | ICD-10-CM | POA: Diagnosis not present

## 2020-03-12 ENCOUNTER — Ambulatory Visit: Payer: 59

## 2020-03-25 ENCOUNTER — Other Ambulatory Visit: Payer: Self-pay | Admitting: Family

## 2020-03-25 DIAGNOSIS — Z1231 Encounter for screening mammogram for malignant neoplasm of breast: Secondary | ICD-10-CM

## 2020-04-19 ENCOUNTER — Other Ambulatory Visit: Payer: Self-pay

## 2020-04-19 ENCOUNTER — Ambulatory Visit (INDEPENDENT_AMBULATORY_CARE_PROVIDER_SITE_OTHER): Payer: 59 | Admitting: Family

## 2020-04-19 ENCOUNTER — Inpatient Hospital Stay: Admission: RE | Admit: 2020-04-19 | Payer: 59 | Source: Ambulatory Visit

## 2020-04-19 VITALS — BP 112/65 | HR 73 | Temp 98.6°F | Resp 16 | Ht 67.0 in | Wt 189.0 lb

## 2020-04-19 DIAGNOSIS — R739 Hyperglycemia, unspecified: Secondary | ICD-10-CM

## 2020-04-19 DIAGNOSIS — Z1231 Encounter for screening mammogram for malignant neoplasm of breast: Secondary | ICD-10-CM

## 2020-04-19 DIAGNOSIS — E041 Nontoxic single thyroid nodule: Secondary | ICD-10-CM | POA: Diagnosis not present

## 2020-04-19 LAB — COMPREHENSIVE METABOLIC PANEL
ALT: 10 U/L (ref 0–35)
AST: 15 U/L (ref 0–37)
Albumin: 3.9 g/dL (ref 3.5–5.2)
Alkaline Phosphatase: 63 U/L (ref 39–117)
BUN: 9 mg/dL (ref 6–23)
CO2: 30 mEq/L (ref 19–32)
Calcium: 9.1 mg/dL (ref 8.4–10.5)
Chloride: 103 mEq/L (ref 96–112)
Creatinine, Ser: 0.75 mg/dL (ref 0.40–1.20)
GFR: 99.81 mL/min (ref >60.00)
Glucose, Bld: 97 mg/dL (ref 70–99)
Potassium: 4.2 mEq/L (ref 3.5–5.1)
Sodium: 137 mEq/L (ref 135–145)
Total Bilirubin: 0.5 mg/dL (ref 0.2–1.2)
Total Protein: 6.9 g/dL (ref 6.0–8.3)

## 2020-04-19 LAB — HEMOGLOBIN A1C: Hgb A1c MFr Bld: 6.3 % (ref 4.6–6.5)

## 2020-04-19 LAB — TSH: TSH: 1.38 u[IU]/mL (ref 0.35–4.50)

## 2020-04-19 NOTE — Progress Notes (Signed)
Subjective:    Patient ID: Sara Finley, female    DOB: 05-Dec-1980, 40 y.o.   MRN: [id number]  HPI  Patient presents today for routine follow-up.  Thyroid nodule-  S/P FNA with benign cytology and negative Afirma testing    Lab Results  Component Value Date   TSH 0.74 07/23/2019   Hyperglycemia- She is working on diet.  She is trying ot eat more vegetables and less carbs.  She is avoiding bread.  She has stopped using creamer in her coffee.   Lab Results  Component Value Date   HGBA1C 6.1 07/23/2019     Review of Systems    see HPI  Past Medical History:  Diagnosis Date  . Abnormal Pap smear of cervix 2010   + HPV. normal pap since   . GERD (gastroesophageal reflux disease)   . Hyperglycemia   . Prediabetes   . Stress incontinence   . Thyroid nodule      Social History   Socioeconomic History  . Marital status: Divorced    Spouse name: Not on file  . Number of children: Not on file  . Years of education: Not on file  . Highest education level: Not on file  Occupational History  . Not on file  Tobacco Use  . Smoking status: Never Smoker  . Smokeless tobacco: Never Used  Vaping Use  . Vaping Use: Never used  Substance and Sexual Activity  . Alcohol use: Yes    Alcohol/week: 0.0 standard drinks  . Drug use: No  . Sexual activity: Not Currently    Birth control/protection: Abstinence  Other Topics Concern  . Not on file  Social History Narrative   1 daughter born 06/17/13   Married   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[employer]   Enjoys exercise, dancing, hiking, music, reading   Grew up in [place], moved here 19 for school, some family is here and some back home   Social Determinants of Corporate investment banker Strain: Not on [employer] Insecurity: Not on file  Transportation Needs: Not on file  Physical Activity: Not on file  Stress: Not on file  Social Connections: Not on file  Intimate Partner Violence: Not on file    Past  Surgical History:  Procedure Laterality Date  . APPENDECTOMY    . BREAST ENHANCEMENT SURGERY    . BREAST ENHANCEMENT SURGERY  09/2018   Removal   . BREAST IMPLANT REMOVAL  10/2018  . COLPOSCOPY  ~2010   Normal  . UMBILICAL HERNIA REPAIR  @ age 13    Family History  Problem Relation Age of Onset  . Hypertension Mother   . Hyperlipidemia Mother   . Diabetes Mellitus II Mother   . Hypertension Paternal Grandmother   . Stroke Paternal Grandmother   . Hypertension Father   . Hyperlipidemia Father   . Diabetes Father        ?pre-diabetes  . Benign prostatic hyperplasia Father   . Sleep apnea Father   . Asthma Father   . Cataracts Father   . Hypertension Paternal Uncle   . Heart disease Paternal Uncle 50       MI/CABG x 4- died after failed bipass  . Sleep apnea Brother   . Anemia Sister        hyperglycemia  . Sickle cell trait Sister   . Goiter Sister     Allergies  Allergen Reactions  . Asa [Aspirin] Other (See Comments)  gastriitis  . Belviq [Lorcaserin Hcl] Other (See Comments)    Dizziness, nausea    Current Outpatient Medications on File Prior to Visit  Medication Sig Dispense Refill  . Multiple Vitamin (MULTIVITAMIN WITH MINERALS) TABS tablet Take 1 tablet by mouth daily.     No current facility-administered medications on file prior to visit.    BP 112/65 (BP Location: Right Arm, Patient Position: Sitting, Cuff Size: Small)   Pulse 73   Temp 98.6 F (37 C) (Oral)   Resp 16   Ht 5\' 7"  (1.702 m)   Wt 189 lb (85.7 kg)   SpO2 100%   BMI 29.60 kg/m    Objective:   Physical Exam Constitutional:      Appearance: She is well-developed and well-nourished.  Neck:     Comments: Mild thyromegaly Cardiovascular:     Rate and Rhythm: Normal rate and regular rhythm.     Heart sounds: Normal heart sounds. No murmur heard.   Pulmonary:     Effort: Pulmonary effort is normal. No respiratory distress.     Breath sounds: Normal breath sounds. No wheezing.   Musculoskeletal:     Cervical back: Neck supple.  Skin:    General: Skin is warm and dry.  Neurological:     Mental Status: She is alert and oriented to person, place, and time.  Psychiatric:        Mood and Affect: Mood and affect normal.        Behavior: Behavior normal.        Thought Content: Thought content normal.        Judgment: Judgment normal.           Assessment & Plan:  Hx of Thyroid Nodule- obtain follow up TSH. Clinically stable.   Hyperglycemia- obtain follow up A1C. Discussed healthy diet and exercise.  This visit occurred during the SARS-CoV-2 public health emergency.  Safety protocols were in place, including screening questions prior to the visit, additional usage of staff PPE, and extensive cleaning of exam room while observing appropriate contact time as indicated for disinfecting solutions.

## 2020-04-19 NOTE — Patient Instructions (Signed)
Please complete lab work prior to leaving.   

## 2020-06-07 ENCOUNTER — Telehealth: Payer: 59 | Admitting: Physician Assistant

## 2020-06-07 ENCOUNTER — Encounter: Payer: Self-pay | Admitting: Family

## 2020-06-07 DIAGNOSIS — J069 Acute upper respiratory infection, unspecified: Secondary | ICD-10-CM

## 2020-06-07 MED ORDER — BENZONATATE 200 MG PO CAPS: 200.0000 mg | ORAL_CAPSULE | Freq: Three times a day (TID) | ORAL | 0 refills | Status: DC | PRN

## 2020-06-07 MED ORDER — PREDNISONE 10 MG (21) PO TBPK: ORAL_TABLET | ORAL | 0 refills | Status: DC

## 2020-06-07 NOTE — Progress Notes (Signed)
We are sorry that you are not feeling well.  Here is how we plan to help!  Based on your presentation I believe you most likely have A cough due to a virus.  This is called viral bronchitis and is best treated by rest, plenty of fluids and control of the cough.  You may use Ibuprofen or Tylenol as directed to help your symptoms.     In addition you may use A prescription cough medication called Tessalon Perles 100mg . You may take 1-2 capsules every 8 hours as needed for your cough.  Prednisone 5 mg daily for 6 days (see taper instructions below)  Take 6 tablets on Day 1 (divided up throughout the day) Take 5 tablets on Day 2  Take 4 tablets on Day 3 Take 3 tablets on Day 4 Take 2 tablets on Day 5 Take 1 tablet on Day 6  From your responses in the eVisit questionnaire you describe inflammation in the upper respiratory tract which is causing a significant cough.  This is commonly called Bronchitis and has four common causes:    Allergies  Viral Infections  Acid Reflux  Bacterial Infection Allergies, viruses and acid reflux are treated by controlling symptoms or eliminating the cause. An example might be a cough caused by taking certain blood pressure medications. You stop the cough by changing the medication. Another example might be a cough caused by acid reflux. Controlling the reflux helps control the cough.  USE OF BRONCHODILATOR ("RESCUE") INHALERS: There is a risk from using your bronchodilator too frequently.  The risk is that over-reliance on a medication which only relaxes the muscles surrounding the breathing tubes can reduce the effectiveness of medications prescribed to reduce swelling and congestion of the tubes themselves.  Although you feel brief relief from the bronchodilator inhaler, your asthma may actually be worsening with the tubes becoming more swollen and filled with mucus.  This can delay other crucial treatments, such as oral steroid medications. If you need to use a  bronchodilator inhaler daily, several times per day, you should discuss this with your provider.  There are probably better treatments that could be used to keep your asthma under control.     HOME CARE . Only take medications as instructed by your medical team. . Complete the entire course of an antibiotic. . Drink plenty of fluids and get plenty of rest. . Avoid close contacts especially the very young and the elderly . Cover your mouth if you cough or cough into your sleeve. . Always remember to wash your hands . A steam or ultrasonic humidifier can help congestion.   GET HELP RIGHT AWAY IF: . You develop worsening fever. . You become short of breath . You cough up blood. . Your symptoms persist after you have completed your treatment plan MAKE SURE YOU   Understand these instructions.  Will watch your condition.  Will get help right away if you are not doing well or get worse.  You can also use Mucinex DM for cough and congestion. You can add salt water gargles and/or chloraseptic spray for the sore throat.  Your e-visit answers were reviewed by a board certified advanced clinical practitioner to complete your personal care plan.  Depending on the condition, your plan could have included both over the counter or prescription medications. If there is a problem please reply  once you have received a response from your provider. Your safety is important to .  If you have drug allergies check  your prescription carefully.    You can use MyChart to ask questions about today's visit, request a non-urgent call back, or ask for a work or school excuse for 24 hours related to this e-Visit. If it has been greater than 24 hours you will need to follow up with your provider, or enter a new e-Visit to address those concerns. You will get an e-mail in the next two days asking about your experience.  I hope that your e-visit has been valuable and will speed your recovery. Thank you for using  e-visits.  I provided 7 minutes of non face-to-face time during this encounter for chart review and documentation.

## 2020-06-08 ENCOUNTER — Telehealth: Payer: Self-pay | Admitting: Family

## 2020-06-08 DIAGNOSIS — U071 COVID-19: Secondary | ICD-10-CM

## 2020-06-08 MED ORDER — PROMETHAZINE-DM 6.25-15 MG/5ML PO SYRP: 5.0000 mL | ORAL_SOLUTION | Freq: Four times a day (QID) | ORAL | 0 refills | Status: DC | PRN

## 2020-06-08 MED ORDER — LIDOCAINE VISCOUS HCL 2 % MT SOLN: 10.0000 mL | Freq: Four times a day (QID) | OROMUCOSAL | 0 refills | Status: DC | PRN

## 2020-06-08 NOTE — Telephone Encounter (Signed)
Pt contacted me to let me know that she tested + for covid-19 infection.  Having fatigue, constant cough and sore throat. Had e-visit yesterday and was rx'd prednisone taper.  Will refer for Covid Treatment and send rx for viscous lidocaine and promethazine DM cough syrup to try if no improvement with tessalon.

## 2020-06-09 ENCOUNTER — Telehealth (HOSPITAL_COMMUNITY): Payer: Self-pay

## 2020-06-09 NOTE — Telephone Encounter (Signed)
Called to discuss with patient about COVID-19 symptoms and the use of one of the available treatments for those with mild to moderate Covid symptoms and at a high risk of hospitalization.  Pt appears to qualify for outpatient treatment due to co-morbid conditions and/or a member of an at-risk group in accordance with the FDA Emergency Use Authorization.    Symptom onset: unknown Vaccinated: Yes Booster: Yes Immunocompromised: unknown Qualifiers: unknown  Unable to reach pt - LVM on 06/09/20 @ 1310  Essie Hart, RN

## 2020-06-09 NOTE — Telephone Encounter (Signed)
Called to discuss with patient about COVID-19 symptoms and the use of one of the available treatments for those with mild to moderate Covid symptoms and at a high risk of hospitalization.  Pt appears to qualify for outpatient treatment due to co-morbid conditions and/or a member of an at-risk group in accordance with the FDA Emergency Use Authorization.    Symptom onset: Saturday 4/9, at the time c/o fever, headache, cough, and sore throat, currently c/o nasal congestion and cough Vaccinated: yes Booster: yes Immunocompromised: no Qualifiers: obesity, pre-diabetes  RN informed pt that one of our APPs would be calling her to discuss possible treatment options.   Essie Hart, RN

## 2020-06-10 ENCOUNTER — Telehealth: Payer: Self-pay | Admitting: Physician Assistant

## 2020-06-10 NOTE — Telephone Encounter (Signed)
Called to discuss with patient about COVID-19 symptoms and the use of one of the available treatments for those with mild to moderate Covid symptoms and at a high risk of hospitalization.  Pt appears to qualify for outpatient treatment due to co-morbid conditions and/or a member of an at-risk group in accordance with the FDA Emergency Use Authorization.    Pt is out of the oral window on day 6 and will be on day 10 by the time we offer the infusion next (Monday 4/18). Continue supportive care.    Sara Finley

## 2020-06-10 NOTE — Telephone Encounter (Signed)
Please check with patient and see how she is feeling. Tell her I am covering and would be happy to talk to her after clinic about her symptoms if she would like.

## 2020-06-11 ENCOUNTER — Other Ambulatory Visit: Payer: Self-pay

## 2020-06-11 ENCOUNTER — Ambulatory Visit
Admission: RE | Admit: 2020-06-11 | Discharge: 2020-06-11 | Disposition: A | Discharge status: Home / Self Care | Payer: 59 | Source: Ambulatory Visit | Attending: Family | Admitting: Family

## 2020-06-11 DIAGNOSIS — Z1231 Encounter for screening mammogram for malignant neoplasm of breast: Secondary | ICD-10-CM | POA: Diagnosis not present

## 2020-06-14 NOTE — Telephone Encounter (Signed)
Patient reports she is much better and ready to go back to work tomorrow.

## 2020-08-10 ENCOUNTER — Other Ambulatory Visit (HOSPITAL_BASED_OUTPATIENT_CLINIC_OR_DEPARTMENT_OTHER): Payer: Self-pay

## 2020-08-10 MED ORDER — CARESTART COVID-19 HOME TEST VI KIT
PACK | 0 refills | Status: DC
  Filled 2020-08-10: qty 2, 4d supply, fill #0

## 2020-08-19 DIAGNOSIS — Z20828 Contact with and (suspected) exposure to other viral communicable diseases: Secondary | ICD-10-CM | POA: Diagnosis not present

## 2020-10-27 ENCOUNTER — Other Ambulatory Visit (HOSPITAL_BASED_OUTPATIENT_CLINIC_OR_DEPARTMENT_OTHER): Payer: Self-pay

## 2020-11-02 ENCOUNTER — Other Ambulatory Visit: Payer: Self-pay

## 2020-11-02 ENCOUNTER — Ambulatory Visit (INDEPENDENT_AMBULATORY_CARE_PROVIDER_SITE_OTHER): Payer: 59 | Admitting: Family

## 2020-11-02 ENCOUNTER — Encounter: Payer: Self-pay | Admitting: Family

## 2020-11-02 VITALS — BP 112/73 | HR 77 | Temp 98.5°F | Resp 16 | Wt 191.0 lb

## 2020-11-02 DIAGNOSIS — E041 Nontoxic single thyroid nodule: Secondary | ICD-10-CM | POA: Diagnosis not present

## 2020-11-02 DIAGNOSIS — R42 Dizziness and giddiness: Secondary | ICD-10-CM

## 2020-11-02 DIAGNOSIS — Z0000 Encounter for general adult medical examination without abnormal findings: Secondary | ICD-10-CM

## 2020-11-02 DIAGNOSIS — Z23 Encounter for immunization: Secondary | ICD-10-CM | POA: Diagnosis not present

## 2020-11-02 DIAGNOSIS — M79672 Pain in left foot: Secondary | ICD-10-CM

## 2020-11-02 DIAGNOSIS — M79671 Pain in right foot: Secondary | ICD-10-CM

## 2020-11-02 DIAGNOSIS — R7303 Prediabetes: Secondary | ICD-10-CM

## 2020-11-02 DIAGNOSIS — E559 Vitamin D deficiency, unspecified: Secondary | ICD-10-CM | POA: Diagnosis not present

## 2020-11-02 LAB — COMPREHENSIVE METABOLIC PANEL
ALT: 13 U/L (ref 0–35)
AST: 17 U/L (ref 0–37)
Albumin: 3.9 g/dL (ref 3.5–5.2)
Alkaline Phosphatase: 58 U/L (ref 39–117)
BUN: 9 mg/dL (ref 6–23)
CO2: 29 mEq/L (ref 19–32)
Calcium: 8.9 mg/dL (ref 8.4–10.5)
Chloride: 104 mEq/L (ref 96–112)
Creatinine, Ser: 0.76 mg/dL (ref 0.40–1.20)
GFR: 97.86 mL/min (ref >60.00)
Glucose, Bld: 84 mg/dL (ref 70–99)
Potassium: 4.4 mEq/L (ref 3.5–5.1)
Sodium: 138 mEq/L (ref 135–145)
Total Bilirubin: 0.4 mg/dL (ref 0.2–1.2)
Total Protein: 6.9 g/dL (ref 6.0–8.3)

## 2020-11-02 LAB — VITAMIN D 25 HYDROXY (VIT D DEFICIENCY, FRACTURES): VITD: 21.64 ng/mL — ABNORMAL LOW (ref 30.00–100.00)

## 2020-11-02 LAB — TSH: TSH: 1.35 u[IU]/mL (ref 0.35–5.50)

## 2020-11-02 LAB — HEMOGLOBIN A1C: Hgb A1c MFr Bld: 6.5 % (ref 4.6–6.5)

## 2020-11-02 LAB — URIC ACID: Uric Acid, Serum: 5.1 mg/dL (ref 2.4–7.0)

## 2020-11-02 NOTE — Assessment & Plan Note (Signed)
Mild.  We discussed meclizine and PT vestibular rehab. She wishes to do some vestibular exercises at home on her own.

## 2020-11-02 NOTE — Assessment & Plan Note (Signed)
Encouraged pt to continue healthy diet, exercise. She has had one gardisil at her GYN office and would like to complete the series.  Will give second dose today and 3rd dose in 4 months.

## 2020-11-02 NOTE — Addendum Note (Signed)
Addended by: Wilford Corner on: 11/02/2020 02:01 PM   Modules accepted: Orders

## 2020-11-02 NOTE — Patient Instructions (Signed)
Please complete lab work prior to leaving.   

## 2020-11-02 NOTE — Progress Notes (Signed)
Subjective:     Patient ID: Sara Finley, female    DOB: [birth year], 40 y.o.   MRN: [id number]  Chief Complaint  Patient presents with   Annual Exam    HPI  Patient presents today for complete physical.  Immunizations: Td 2018, would like second gardisil.  Diet: diet is good Wt Readings from Last 3 Encounters:  11/02/20 191 lb (86.6 kg)  04/19/20 189 lb (85.7 kg)  11/27/19 187 lb (84.8 kg)   Notes that she gets some fullness in her head, has tinnitis, worse with allergies.  She has had vertigo in the past.  Feels like her hearing is OK.   Exercise:she hurt her foot, not getting regular exercise. Hurts left second and third toes at the base.  Has ben bothering her 1 month Pap Smear: due- will have with GYN Mammogram: up to date Vision: up to date Dental: up to date  Sometimes her periods are irregular  Health Maintenance Due  Topic Date Due   Hepatitis C Screening  Never done   PAP SMEAR-Modifier  09/05/2019   INFLUENZA VACCINE  09/27/2020    Past Medical History:  Diagnosis Date   Abnormal Pap smear of cervix 2010   + HPV. normal pap since    GERD (gastroesophageal reflux disease)    Hyperglycemia    Prediabetes    Stress incontinence    Thyroid nodule     Past Surgical History:  Procedure Laterality Date   APPENDECTOMY     BREAST ENHANCEMENT SURGERY     BREAST ENHANCEMENT SURGERY  09/2018   Removal    BREAST IMPLANT REMOVAL  10/2018   COLPOSCOPY  ~3559   Normal   UMBILICAL HERNIA REPAIR  @ age 66    Family History  Problem Relation Age of Onset   Hypertension Mother    Hyperlipidemia Mother    Diabetes Mellitus II Mother    Hypertension Paternal Grandmother    Stroke Paternal Grandmother    Hypertension Father    Hyperlipidemia Father    Diabetes Father        ?pre-diabetes   Benign prostatic hyperplasia Father    Sleep apnea Father    Asthma Father    Cataracts Father    Hypertension Paternal Uncle    Heart disease Paternal Uncle 39        MI/CABG x 4- died after failed bipass   Sleep apnea Brother    Anemia Sister        hyperglycemia   Sickle cell trait Sister    Goiter Sister     Social History   Socioeconomic History   Marital status: Divorced    Spouse name: Not on file   Number of children: Not on file   Years of education: Not on file   Highest education level: Not on file  Occupational History   Not on file  Tobacco Use   Smoking status: Never   Smokeless tobacco: Never  Vaping Use   Vaping Use: Never used  Substance and Sexual Activity   Alcohol use: Yes    Comment: 2-3 a week   Drug use: No   Sexual activity: Not Currently    Birth control/protection: Abstinence  Other Topics Concern   Not on file  Social History Narrative   1 daughter born 06/17/13   Married   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 (Patient not taking: Reported on 11/02/2020)     benzonatate (TESSALON) 200 MG capsule Take 1 capsule (200 mg total) by mouth 3 (three) times daily as needed for cough. 20 capsule 0   COVID-19 At Home Antigen Test (CARESTART COVID-19 HOME TEST) KIT use as directed within package instructions 2 kit 0   lidocaine (XYLOCAINE) 2 % solution Use as directed 10 mLs in the mouth or throat every 6 (six) hours as needed for mouth pain. 200 mL 0   metroNIDAZOLE (METROGEL) 0.75 % vaginal gel PLACE 1 APPLICATORFUL VAGINALLY AT BEDTIME FOR  5 DAYS. 70 g 0   predniSONE (STERAPRED UNI-PAK 21 TAB) 10 MG (21) TBPK tablet 6 day taper; take as directed on package instructions 21 tablet 0   promethazine-dextromethorphan (PROMETHAZINE-DM) 6.25-15 MG/5ML syrup Take 5 mLs by mouth every 6 (six) hours as needed for cough. 150 mL 0   No facility-administered medications prior to visit.    Allergies  Allergen Reactions   Asa [Aspirin] Other (See Comments)    gastriitis   Belviq [Lorcaserin Hcl] Other (See Comments)    Dizziness, nausea    Review of Systems  Constitutional:  Negative for weight loss.  HENT:  Positive for congestion (allergies).   Eyes:  Negative for blurred vision.  Respiratory:  Negative for cough.   Cardiovascular:  Negative for chest pain and leg swelling.  Gastrointestinal:  Negative for constipation and diarrhea.  Genitourinary:  Negative for dysuria and frequency.  Musculoskeletal:  Negative for joint pain and myalgias.  Neurological:  Positive for dizziness.  Endo/Heme/Allergies:  Positive for environmental allergies.  Psychiatric/Behavioral:  Negative for depression.       Objective:    Physical Exam  BP 112/73 (BP Location: Right Arm, Patient Position: Sitting, Cuff Size: Small)   Pulse 77   Temp 98.5 F (36.9 C) (Oral)   Resp 16   Wt 191 lb (86.6 kg)   SpO2 100%   BMI 29.91 kg/m  Wt Readings from Last 3 Encounters:  11/02/20 191 lb (86.6 kg)  04/19/20 189 lb (85.7 kg)  11/27/19 187 lb (84.8 kg)     Physical Exam  Constitutional: She is oriented to person, place, and time. She appears well-developed and well-nourished. No distress.  HENT:  Head: Normocephalic and atraumatic.  Right Ear: Tympanic membrane and ear canal normal.  Left Ear: Tympanic membrane and ear canal normal.  Mouth/Throat: Not examined- pt wearing mask Eyes: Pupils are equal, round, and reactive to light. No scleral icterus.  Neck: Normal range of motion. mild right sided thyroid fullness noted Cardiovascular: Normal  rate and regular rhythm.   No murmur heard. Pulmonary/Chest: Effort normal and breath sounds normal. No respiratory distress. He has no wheezes. She has no rales. She exhibits no tenderness.  Abdominal: Soft. Bowel sounds are normal. She exhibits no distension and no mass. There is no tenderness. There is no rebound and no guarding.  Musculoskeletal: She exhibits no edema.  Lymphadenopathy:  She has no cervical adenopathy.  Neurological: She is alert and oriented to person, place, and time. She has normal patellar reflexes. She exhibits normal muscle tone. Coordination normal. Mildly positive left dix hall pike maneuver. Negative on right.  Skin: Skin is warm and dry.  Psychiatric: She has a normal mood and affect. Her behavior is normal. Judgment and thought content normal. Breast/Pelvic: deferred          Assessment & Plan:    Assessment & Plan:   Problem List Items Addressed This Visit       Unprioritized   Vitamin D deficiency   Relevant Orders   Vitamin D (25 hydroxy)   Vertigo    Mild.  We discussed meclizine and PT vestibular rehab. She wishes to do some vestibular exercises at home on her own.       Thyroid nodule   Relevant Orders   TSH   Preventative health care - Primary    Encouraged pt to continue healthy diet, exercise. She has had one gardisil at her GYN office and would like to complete the series.  Will give second dose today and 3rd dose in 4 months.       Prediabetes   Relevant Orders   Comp Met (CMET)   Hemoglobin A1c   Other Visit Diagnoses     left foot pain       Relevant Orders   Uric acid   Left foot pain       Relevant Orders   DG Foot Complete Left       I have discontinued Setsuko Weinfeld's metroNIDAZOLE, benzonatate, predniSONE, promethazine-dextromethorphan, lidocaine, and Carestart COVID-19 Home Test. I am also having her maintain her multivitamin with minerals.  No orders of the defined types were placed in this  encounter.

## 2020-11-04 ENCOUNTER — Encounter: Payer: Self-pay | Admitting: Family

## 2020-11-04 ENCOUNTER — Telehealth: Payer: Self-pay | Admitting: Family

## 2020-11-04 DIAGNOSIS — E559 Vitamin D deficiency, unspecified: Secondary | ICD-10-CM

## 2020-11-04 MED ORDER — VITAMIN D (ERGOCALCIFEROL) 1.25 MG (50000 UNIT) PO CAPS
50000.0000 [IU] | ORAL_CAPSULE | ORAL | 0 refills | Status: DC
  Filled 2020-11-23 – 2020-12-01 (×2): qty 8, 56d supply, fill #0

## 2020-11-04 NOTE — Telephone Encounter (Signed)
Vitamin D level is low.  Advise patient to begin vit D 50000 units once weekly for 12 weeks, then repeat vit D level (dx Vit D deficiency).    Also, A1C has risen to 6.5 which places her in the diabetes range. Please continue work on healthy diabetic diet, exercise and weight loss.  Thyroid function is normal.

## 2020-11-05 ENCOUNTER — Other Ambulatory Visit: Payer: Self-pay

## 2020-11-05 ENCOUNTER — Other Ambulatory Visit: Payer: 59

## 2020-11-05 ENCOUNTER — Ambulatory Visit (INDEPENDENT_AMBULATORY_CARE_PROVIDER_SITE_OTHER): Payer: 59

## 2020-11-05 DIAGNOSIS — M79672 Pain in left foot: Secondary | ICD-10-CM

## 2020-11-05 NOTE — Telephone Encounter (Signed)
Patient advised of results and provider's advise. She will pick up new rx and work on her diet.

## 2020-11-08 ENCOUNTER — Encounter: Payer: Self-pay | Admitting: Family

## 2020-11-10 ENCOUNTER — Telehealth: Payer: Self-pay | Admitting: Family

## 2020-11-10 NOTE — Telephone Encounter (Signed)
See mychart.  

## 2020-11-23 ENCOUNTER — Other Ambulatory Visit (HOSPITAL_BASED_OUTPATIENT_CLINIC_OR_DEPARTMENT_OTHER): Payer: Self-pay

## 2020-12-01 ENCOUNTER — Other Ambulatory Visit (HOSPITAL_BASED_OUTPATIENT_CLINIC_OR_DEPARTMENT_OTHER): Payer: Self-pay

## 2020-12-01 MED ORDER — CARESTART COVID-19 HOME TEST VI KIT
PACK | 0 refills | Status: DC
  Filled 2020-12-01: qty 2, 4d supply, fill #0

## 2020-12-22 NOTE — Progress Notes (Signed)
40 y.o. G35P1021 Divorced Black or African American Not Hispanic or Latino female here for annual exam.   Period Cycle (Days): 28 Period Duration (Days): 7 Period Pattern: Regular Menstrual Flow: Moderate Dysmenorrhea: (!) Mild Dysmenorrhea Symptoms: Cramping  H/O mixed incontinence, OAB. Stable and tolerable.   Recent HgbA1C was 6.5, she is going to work on diet and exercise. Just started to exercise 3 x a week, wants to get up to 4 x a week.   Patient's last menstrual period was 12/13/2020.          Sexually active: No.  The current method of family planning is none.    Exercising: Yes.    Gym/ health club routine includes cardio. 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  MMG:  06-11-20 normal BMD:   N/A Colonoscopy: N/A TDaP:  2018 Gardasil 2 injections   reports that she has never smoked. She has never used smokeless tobacco. She reports that she does not currently use alcohol. She reports that she does not use drugs. Rare ETOH.   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 Moved here from [place] ([place] in 2003.  Divorced and has her daughter full time.   Past Medical History:  Diagnosis Date   Abnormal Pap smear of cervix 2010   + HPV. normal pap since    GERD (gastroesophageal reflux disease)    Hyperglycemia    Prediabetes    Stress incontinence    Thyroid nodule    Type 2 diabetes mellitus without complications (HCC) 01/09/2014   A1C 6.0 8/15    Past Surgical History:  Procedure Laterality Date   APPENDECTOMY     BREAST ENHANCEMENT SURGERY     BREAST ENHANCEMENT SURGERY  09/2018   Removal    BREAST IMPLANT REMOVAL  10/2018   COLPOSCOPY  ~2010   Normal   UMBILICAL HERNIA REPAIR  @ age 16    Current Outpatient Medications  Medication Sig Dispense Refill   Multiple Vitamin  (MULTIVITAMIN WITH MINERALS) TABS tablet Take 1 tablet by mouth daily.     Vitamin D, Ergocalciferol, (DRISDOL) 1.25 MG (50000 UNIT) CAPS capsule Take 1 capsule (50,000 Units total) by mouth every 7 (seven) days. 12 capsule 0   No current facility-administered medications for this visit.    Family History  Problem Relation Age of Onset   Hypertension Mother    Hyperlipidemia Mother    Diabetes Mellitus II Mother    Hypertension Paternal Grandmother    Stroke Paternal Grandmother    Hypertension Father    Hyperlipidemia Father    Diabetes Father        ?pre-diabetes   Benign prostatic hyperplasia Father    Sleep apnea Father    Asthma Father    Cataracts Father    Hypertension Paternal Uncle    Heart disease Paternal Uncle 20       MI/CABG x 4- died after failed bipass   Sleep apnea Brother    Anemia Sister        hyperglycemia   Sickle cell trait Sister    Goiter Sister     Review of Systems  All other systems reviewed and are negative.  Exam:   BP 120/74 (BP Location: Right Arm, Patient Position: Sitting, Cuff Size: Normal)   Pulse 90   Ht  5\' 2"  (1.575 m)   Wt 193 lb (87.5 kg)   LMP 12/13/2020   SpO2 99%   BMI 35.30 kg/m   Weight change: @WEIGHTCHANGE @ Height:   Height: 5\' 2"  (157.5 cm)  Ht Readings from Last 3 Encounters:  12/31/20 5\' 2"  (1.575 m)  04/19/20 5\' 7"  (1.702 m)  11/27/19 5\' 7"  (1.702 m)    General appearance: alert, cooperative and appears stated age Head: Normocephalic, without obvious abnormality, atraumatic Neck: no adenopathy, supple, symmetrical, trachea midline and thyroid normal to inspection and palpation Lungs: clear to auscultation bilaterally Cardiovascular: regular rate and rhythm Breasts: normal appearance, no masses or tenderness Abdomen: soft, non-tender; non distended,  no masses,  no organomegaly Extremities: extremities normal, atraumatic, no cyanosis or edema Skin: Skin color, texture, turgor normal. No rashes or lesions Lymph  nodes: Cervical, supraclavicular, and axillary nodes normal. No abnormal inguinal nodes palpated Neurologic: Grossly normal   Pelvic: External genitalia:  no lesions              Urethra:  normal appearing urethra with no masses, tenderness or lesions              Bartholins and Skenes: normal                 Vagina: normal appearing vagina with normal color and discharge, no lesions              Cervix: no lesions               Bimanual Exam:  Uterus:  normal size, contour, position, consistency, mobility, non-tender              Adnexa: no mass, fullness, tenderness               Rectovaginal: Confirms               Anus:  normal sphincter tone, no lesions  13/04/22, CMA chaperoned for the exam.  1. Well woman exam Discussed breast self exam Discussed calcium and vit D intake Mammogram UTD Labs with primary  2. Screening for cervical cancer - Cytology - PAP

## 2020-12-31 ENCOUNTER — Ambulatory Visit (INDEPENDENT_AMBULATORY_CARE_PROVIDER_SITE_OTHER): Payer: 59 | Admitting: Obstetrics and Gynecology

## 2020-12-31 ENCOUNTER — Other Ambulatory Visit (HOSPITAL_COMMUNITY)
Admission: RE | Admit: 2020-12-31 | Discharge: 2020-12-31 | Disposition: A | Discharge status: Home / Self Care | Payer: 59 | Source: Ambulatory Visit | Attending: Obstetrics and Gynecology | Admitting: Obstetrics and Gynecology

## 2020-12-31 ENCOUNTER — Encounter: Payer: Self-pay | Admitting: Obstetrics and Gynecology

## 2020-12-31 ENCOUNTER — Other Ambulatory Visit: Payer: Self-pay

## 2020-12-31 VITALS — BP 120/74 | HR 90 | Ht 62.0 in | Wt 193.0 lb

## 2020-12-31 DIAGNOSIS — Z124 Encounter for screening for malignant neoplasm of cervix: Secondary | ICD-10-CM

## 2020-12-31 DIAGNOSIS — Z01419 Encounter for gynecological examination (general) (routine) without abnormal findings: Secondary | ICD-10-CM

## 2020-12-31 NOTE — Patient Instructions (Signed)

## 2021-01-04 LAB — CYTOLOGY - PAP
Comment: NEGATIVE
Diagnosis: NEGATIVE
High risk HPV: NEGATIVE

## 2021-01-30 DIAGNOSIS — H5213 Myopia, bilateral: Secondary | ICD-10-CM | POA: Diagnosis not present

## 2021-03-04 ENCOUNTER — Telehealth: Payer: 59 | Admitting: Emergency Medicine

## 2021-03-04 DIAGNOSIS — H109 Unspecified conjunctivitis: Secondary | ICD-10-CM

## 2021-03-04 MED ORDER — POLYMYXIN B-TRIMETHOPRIM 10000-0.1 UNIT/ML-% OP SOLN: 1.0000 [drp] | OPHTHALMIC | 0 refills | Status: DC

## 2021-03-04 NOTE — Progress Notes (Signed)
E-Visit for [employer]   We are sorry that you are not feeling well.  Here is how we plan to help!  Based on what you have shared with me it looks like you have conjunctivitis.  Conjunctivitis is a common inflammatory or infectious condition of the eye that is often referred to as "pink eye".  In most cases it is contagious (viral or bacterial). However, not all conjunctivitis requires antibiotics (ex. Allergic).  We have made appropriate suggestions for you based upon your presentation.  I have prescribed Polytrim Ophthalmic drops 1-2 drops 4 times a day times 5 days  DISCONTINUE ALL other drops until symptoms resolve.  Pink eye can be highly contagious.  It is typically spread through direct contact with secretions, or contaminated objects or surfaces that one may have touched.  Strict handwashing is suggested with soap and water is urged.  If not available, use alcohol based had sanitizer.  Avoid unnecessary touching of the eye.  If you wear contact lenses, you will need to refrain from wearing them until you see no white discharge from the eye for at least 24 hours after being on medication.  You should see symptom improvement in 1-2 days after starting the medication regimen.  Call us if symptoms are not improved in 1-2 days.  Home Care: Wash your hands often! Do not wear your contacts until you complete your treatment plan. Avoid sharing towels, bed linen, personal items with a person who has pink eye. See attention for anyone in your home with similar symptoms.  Get Help Right Away If: Your symptoms do not improve. You develop blurred or loss of vision. Your symptoms worsen (increased discharge, pain or redness)   Thank you for choosing an e-visit.  Your e-visit answers were reviewed by a board certified advanced clinical practitioner to complete your personal care plan. Depending upon the condition, your plan could have included both over the counter or prescription  medications.  Please review your pharmacy choice. Make sure the pharmacy is open so you can pick up prescription now. If there is a problem, you may contact your provider through [employer] and have the prescription routed to another pharmacy.  Your safety is important to [place]. If you have drug allergies check your prescription carefully.   For the next 24 hours you can use MyChart to ask questions about today's visit, request a non-urgent call back, or ask for a work or school excuse. You will get an email in the next two days asking about your experience. I hope that your e-visit has been valuable and will speed your recovery.  Approximately 5 minutes was used in reviewing the patient's chart, questionnaire, prescribing medications, and documentation.

## 2021-04-11 LAB — HM DIABETES EYE EXAM

## 2021-05-09 ENCOUNTER — Telehealth: Payer: Self-pay | Admitting: Family

## 2021-05-09 ENCOUNTER — Ambulatory Visit: Payer: 59 | Admitting: Family

## 2021-05-09 VITALS — BP 114/63 | HR 68 | Temp 98.3°F | Resp 16 | Wt 195.0 lb

## 2021-05-09 DIAGNOSIS — E119 Type 2 diabetes mellitus without complications: Secondary | ICD-10-CM | POA: Diagnosis not present

## 2021-05-09 DIAGNOSIS — E559 Vitamin D deficiency, unspecified: Secondary | ICD-10-CM | POA: Diagnosis not present

## 2021-05-09 DIAGNOSIS — Z23 Encounter for immunization: Secondary | ICD-10-CM

## 2021-05-09 LAB — COMPREHENSIVE METABOLIC PANEL
ALT: 11 U/L (ref 0–35)
AST: 15 U/L (ref 0–37)
Albumin: 4 g/dL (ref 3.5–5.2)
Alkaline Phosphatase: 57 U/L (ref 39–117)
BUN: 11 mg/dL (ref 6–23)
CO2: 30 mEq/L (ref 19–32)
Calcium: 9.2 mg/dL (ref 8.4–10.5)
Chloride: 102 mEq/L (ref 96–112)
Creatinine, Ser: 0.75 mg/dL (ref 0.40–1.20)
GFR: 99.07 mL/min (ref >60.00)
Glucose, Bld: 103 mg/dL — ABNORMAL HIGH (ref 70–99)
Potassium: 4.1 mEq/L (ref 3.5–5.1)
Sodium: 137 mEq/L (ref 135–145)
Total Bilirubin: 0.4 mg/dL (ref 0.2–1.2)
Total Protein: 6.4 g/dL (ref 6.0–8.3)

## 2021-05-09 LAB — VITAMIN D 25 HYDROXY (VIT D DEFICIENCY, FRACTURES): VITD: 30.09 ng/mL (ref 30.00–100.00)

## 2021-05-09 LAB — HEMOGLOBIN A1C: Hgb A1c MFr Bld: 6.5 % (ref 4.6–6.5)

## 2021-05-09 LAB — LIPID PANEL
Cholesterol: 146 mg/dL (ref 0–200)
HDL: 46.3 mg/dL (ref >39.00)
LDL Cholesterol: 88 mg/dL (ref 0–99)
NonHDL: 99.25
Total CHOL/HDL Ratio: 3
Triglycerides: 54 mg/dL (ref 0.0–149.0)
VLDL: 10.8 mg/dL (ref 0.0–40.0)

## 2021-05-09 LAB — MICROALBUMIN / CREATININE URINE RATIO
Creatinine,U: 145.1 mg/dL
Microalb Creat Ratio: 0.5 mg/g (ref 0.0–30.0)
Microalb, Ur: <0.7 mg/dL (ref 0.0–1.9)

## 2021-05-09 NOTE — Progress Notes (Addendum)
Subjective:   By signing my name below, I, Sara Finley, attest that this documentation has been prepared under the direction and in the presence of Sara Alar, NP 05/09/2021       Patient ID: Sara Finley, female    DOB: 10/29/1980, 41 y.o.   MRN: [id number]  Chief Complaint  Patient presents with   Hyperglycemia    Here for follow up   Vitamin D deficiency    Here for follow up    HPI Patient is in today for an office visit and 6 month f/u.   She is doing well at this time.  Hyperglycemia- She checks her sugar levels at home. Fasting-80's-90's. After dinner-110's. It was in the 140's twice but it does not happen often. She reports she has been making dietary changes and making a conscious effort to reduce her A1C. Lab Results  Component Value Date   HGBA1C 6.5 11/02/2020    Immunizations- She has received the flu vaccine. She has 3 Covid-19 vaccines and will schedule the booster vaccine for later. She received the gardasil vaccine today.  She takes Vitamin D pills and multivitamins.  Past Medical History:  Diagnosis Date   Abnormal Pap smear of cervix 2010   + HPV. normal pap since    GERD (gastroesophageal reflux disease)    Hyperglycemia    Prediabetes    Stress incontinence    Thyroid nodule    Type 2 diabetes mellitus without complications ([facility]) 38/46/6599   A1C 6.0 8/15    Past Surgical History:  Procedure Laterality Date   APPENDECTOMY     BREAST ENHANCEMENT SURGERY     BREAST ENHANCEMENT SURGERY  09/2018   Removal    BREAST IMPLANT REMOVAL  10/2018   COLPOSCOPY  ~3570   Normal   UMBILICAL HERNIA REPAIR  @ age 50    Family History  Problem Relation Age of Onset   Hypertension Mother    Hyperlipidemia Mother    Diabetes Mellitus II Mother    Hypertension Paternal Grandmother    Stroke Paternal Grandmother    Hypertension Father    Hyperlipidemia Father    Diabetes Father        ?pre-diabetes   Benign prostatic hyperplasia Father     Sleep apnea Father    Asthma Father    Cataracts Father    Hypertension Paternal Uncle    Heart disease Paternal Uncle 25       MI/CABG x 4- died after failed bipass   Sleep apnea Brother    Anemia Sister        hyperglycemia   Sickle cell trait Sister    Goiter Sister     Social History   Socioeconomic History   Marital status: Divorced    Spouse name: Not on file   Number of children: Not on file   Years of education: Not on file   Highest education level: Not on file  Occupational History   Not on file  Tobacco Use   Smoking status: Never   Smokeless tobacco: Never  Vaping Use   Vaping Use: Never used  Substance and Sexual Activity   Alcohol use: Not Currently   Drug use: No   Sexual activity: Not Currently    Birth control/protection: Abstinence  Other Topics Concern   Not on file  Social History Narrative   1 daughter born 06/17/13   Married   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[employer]   Enjoys exercise,  dancing, hiking, music, reading   Grew up in [place], moved here 45 for school, some family is here and some back home   Social Determinants of Health   Financial Resource Strain: Not on file  Food Insecurity: Not on file  Transportation Needs: Not on file  Physical Activity: Not on file  Stress: Not on file  Social Connections: Not on file  Intimate Partner Violence: Not on file    Outpatient Medications Prior to Visit  Medication Sig Dispense Refill   Cholecalciferol (VITAMIN D) 50 MCG (2000 UT) tablet Take 2,000 Units by mouth daily.     Multiple Vitamin (MULTIVITAMIN WITH MINERALS) TABS tablet Take 1 tablet by mouth daily.     trimethoprim-polymyxin b (POLYTRIM) ophthalmic solution Place 1 drop into both eyes every 4 (four) hours. 10 mL 0   Vitamin D, Ergocalciferol, (DRISDOL) 1.25 MG (50000 UNIT) CAPS capsule Take 1 capsule (50,000 Units total) by mouth every 7 (seven) days. 12 capsule 0   No facility-administered medications prior to visit.     Allergies  Allergen Reactions   Asa [Aspirin] Other (See Comments)    gastriitis   Belviq [Lorcaserin Hcl] Other (See Comments)    Dizziness, nausea    ROS See HPI    Objective:    Physical Exam Constitutional:      General: She is not in acute distress.    Appearance: Normal appearance. She is not ill-appearing.  HENT:     Head: Normocephalic and atraumatic.     Right Ear: External ear normal.     Left Ear: External ear normal.  Eyes:     Extraocular Movements: Extraocular movements intact.     Pupils: Pupils are equal, round, and reactive to light.  Cardiovascular:     Rate and Rhythm: Normal rate and regular rhythm.     Pulses: Normal pulses.     Heart sounds: Normal heart sounds. No murmur heard. Pulmonary:     Effort: Pulmonary effort is normal. No respiratory distress.     Breath sounds: Normal breath sounds. No wheezing or rhonchi.  Abdominal:     General: Bowel sounds are normal. There is no distension.     Palpations: Abdomen is soft.     Tenderness: There is no abdominal tenderness. There is no guarding or rebound.  Musculoskeletal:     Cervical back: Neck supple.  Feet:     Comments: Diabetic Foot Exam - Simple   No data filed    Lymphadenopathy:     Cervical: No cervical adenopathy.  Skin:    General: Skin is warm and dry.  Neurological:     Mental Status: She is alert and oriented to person, place, and time.  Psychiatric:        Behavior: Behavior normal.        Judgment: Judgment normal.    BP 114/63 (BP Location: Right Arm, Patient Position: Sitting, Cuff Size: Large)    Pulse 68    Temp 98.3 F (36.8 C) (Oral)    Resp 16    Wt 195 lb (88.5 kg)    SpO2 100%    BMI 35.67 kg/m  Wt Readings from Last 3 Encounters:  05/09/21 195 lb (88.5 kg)  12/31/20 193 lb (87.5 kg)  11/02/20 191 lb (86.6 kg)    Diabetic Foot Exam - Simple   Simple Foot Form Diabetic Foot exam was performed with the following findings: Yes 05/09/2021  8:08 AM  Visual  Inspection No deformities, no ulcerations, no  other skin breakdown bilaterally: Yes Sensation Testing Intact to touch and monofilament testing bilaterally: Yes Pulse Check Posterior Tibialis and Dorsalis pulse intact bilaterally: Yes Comments    Lab Results  Component Value Date   WBC 6.0 07/23/2019   HGB 12.4 07/23/2019   HCT 38.1 07/23/2019   PLT 292.0 07/23/2019   GLUCOSE 84 11/02/2020   CHOL 158 07/23/2019   TRIG 57.0 07/23/2019   HDL 48.50 07/23/2019   LDLCALC 98 07/23/2019   ALT 13 11/02/2020   AST 17 11/02/2020   NA 138 11/02/2020   K 4.4 11/02/2020   CL 104 11/02/2020   CREATININE 0.76 11/02/2020   BUN 9 11/02/2020   CO2 29 11/02/2020   TSH 1.35 11/02/2020   HGBA1C 6.5 11/02/2020    Lab Results  Component Value Date   TSH 1.35 11/02/2020   Lab Results  Component Value Date   WBC 6.0 07/23/2019   HGB 12.4 07/23/2019   HCT 38.1 07/23/2019   MCV 84.5 07/23/2019   PLT 292.0 07/23/2019   Lab Results  Component Value Date   NA 138 11/02/2020   K 4.4 11/02/2020   CO2 29 11/02/2020   GLUCOSE 84 11/02/2020   BUN 9 11/02/2020   CREATININE 0.76 11/02/2020   BILITOT 0.4 11/02/2020   ALKPHOS 58 11/02/2020   AST 17 11/02/2020   ALT 13 11/02/2020   PROT 6.9 11/02/2020   ALBUMIN 3.9 11/02/2020   CALCIUM 8.9 11/02/2020   GFR 97.86 11/02/2020   Lab Results  Component Value Date   CHOL 158 07/23/2019   Lab Results  Component Value Date   HDL 48.50 07/23/2019   Lab Results  Component Value Date   LDLCALC 98 07/23/2019   Lab Results  Component Value Date   TRIG 57.0 07/23/2019   Lab Results  Component Value Date   CHOLHDL 3 07/23/2019   Lab Results  Component Value Date   HGBA1C 6.5 11/02/2020   Diabetic Foot Exam - Simple   Simple Foot Form Diabetic Foot exam was performed with the following findings: Yes 05/09/2021  8:08 AM  Visual Inspection No deformities, no ulcerations, no other skin breakdown bilaterally: Yes Sensation Testing Intact  to touch and monofilament testing bilaterally: Yes Pulse Check Posterior Tibialis and Dorsalis pulse intact bilaterally: Yes Comments        Assessment & Plan:   Problem List Items Addressed This Visit       Unprioritized   Vitamin D deficiency - Primary    Completed 12 week course of vit D 50000 iu. Now on OTC vit D 2000 iu daily. Check follow up level.       Relevant Orders   Vitamin D (25 hydroxy)   Type 2 diabetes mellitus without complications ([place])    Lab Results  Component Value Date   HGBA1C 6.5 11/02/2020  80-142 glucose readings at home. Continue DM diet.  Wt Readings from Last 3 Encounters:  05/09/21 195 lb (88.5 kg)  12/31/20 193 lb (87.5 kg)  11/02/20 191 lb (86.6 kg)  Will recheck A1C. Continue workon DM diet.        Relevant Orders   Hemoglobin A1c   Urine Microalbumin w/creat. ratio   Comp Met (CMET)   Lipid panel   Other Visit Diagnoses     Need for HPV vaccine       Relevant Orders   HPV 9-valent vaccine,Recombinat (Completed)        No orders of the defined types were placed in  this encounter.   I,Sara Finley,acting as a Education administrator for [employer], NP.,have documented all relevant documentation on the behalf of Nance Pear, NP,as directed by  Nance Pear, NP while in the presence of Nance Pear, NP.   I, Sara Alar, NP, personally preformed the services described in this documentation.  All medical record entries made by the scribe were at my direction and in my presence.  I have reviewed the chart and discharge instructions (if applicable) and agree that the record reflects my personal performance and is accurate and complete. 05/09/2021

## 2021-05-09 NOTE — Telephone Encounter (Signed)
Please call [facility] in [place] on [address] and request DM eye exam. ?

## 2021-05-09 NOTE — Telephone Encounter (Signed)
Records request faxed to [facility] in [place] ?

## 2021-05-09 NOTE — Patient Instructions (Signed)
Please complete lab work prior to leaving. ?Please get your bivalent Covid Booster.  ?

## 2021-05-09 NOTE — Assessment & Plan Note (Signed)
Completed 12 week course of vit D 50000 iu. Now on OTC vit D 2000 iu daily. Check follow up level.  ?

## 2021-05-09 NOTE — Assessment & Plan Note (Addendum)
Lab Results  ?Component Value Date  ? HGBA1C 6.5 11/02/2020  ? ?80-142 glucose readings at home. Continue DM diet. ? ?Wt Readings from Last 3 Encounters:  ?05/09/21 195 lb (88.5 kg)  ?12/31/20 193 lb (87.5 kg)  ?11/02/20 191 lb (86.6 kg)  ? ?Will recheck A1C. Continue workon DM diet.  ? ?

## 2021-05-12 ENCOUNTER — Encounter: Payer: Self-pay | Admitting: Family

## 2021-05-12 ENCOUNTER — Other Ambulatory Visit (HOSPITAL_BASED_OUTPATIENT_CLINIC_OR_DEPARTMENT_OTHER): Payer: Self-pay

## 2021-05-12 MED ORDER — METFORMIN HCL 500 MG PO TABS
500.0000 mg | ORAL_TABLET | Freq: Two times a day (BID) | ORAL | 1 refills | Status: DC
  Filled 2021-05-12: qty 180, 90d supply, fill #0
  Filled 2021-10-11: qty 180, 90d supply, fill #1

## 2021-08-04 ENCOUNTER — Other Ambulatory Visit: Payer: Self-pay | Admitting: Family

## 2021-08-04 DIAGNOSIS — Z1231 Encounter for screening mammogram for malignant neoplasm of breast: Secondary | ICD-10-CM

## 2021-08-20 IMAGING — MG MM DIGITAL SCREENING BILAT W/ TOMO AND CAD
8 series · 8 of 24 positions shown · non-contrast
Comparison: None.

CLINICAL DATA: Screening.

EXAM:
DIGITAL SCREENING BILATERAL MAMMOGRAM WITH TOMOSYNTHESIS AND CAD
TECHNIQUE: Bilateral screening digital craniocaudal and mediolateral oblique
mammograms were obtained. Bilateral screening digital breast
tomosynthesis was performed. The images were evaluated with
computer-aided detection.

[L CC synth-2D]
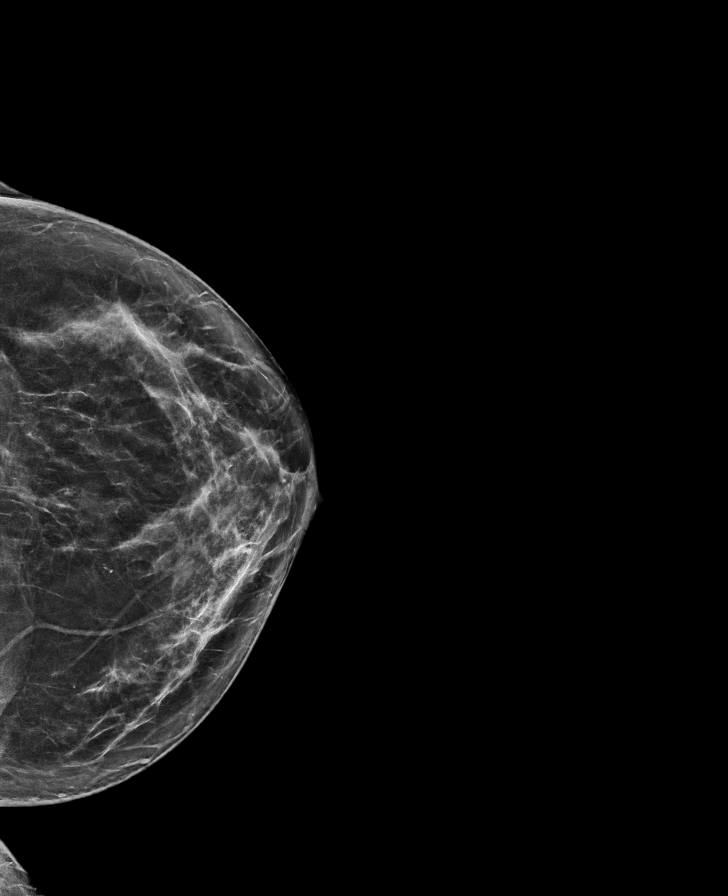

[R CC synth-2D]
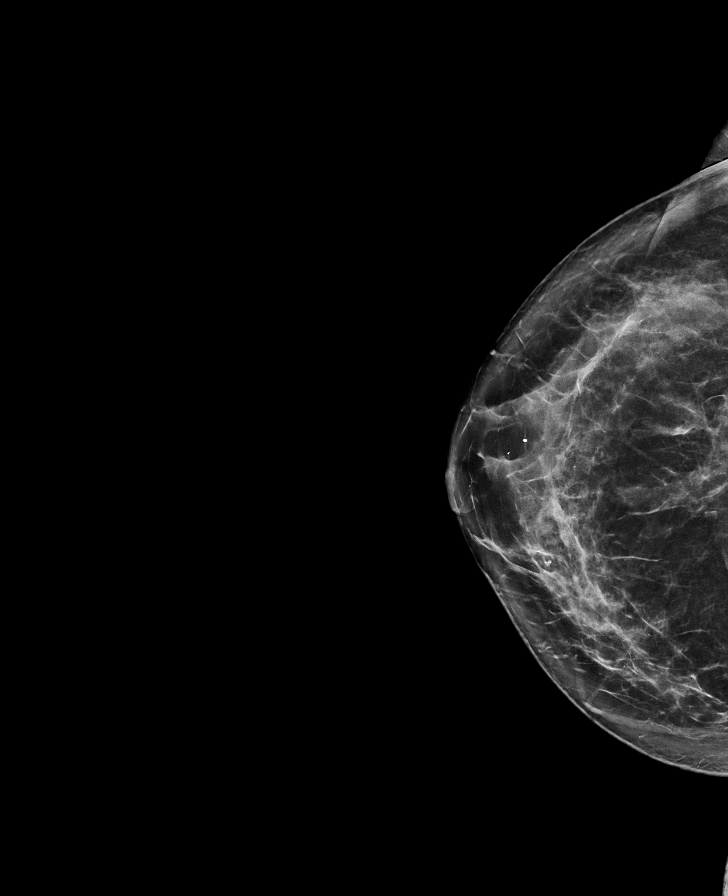

[R MLO synth-2D]
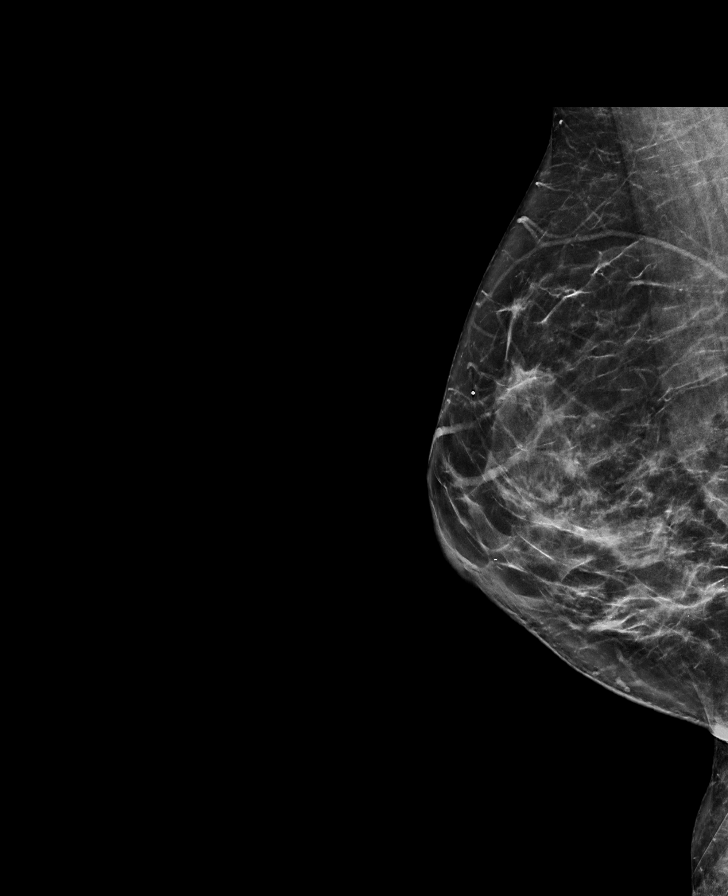

[L MLO synth-2D]
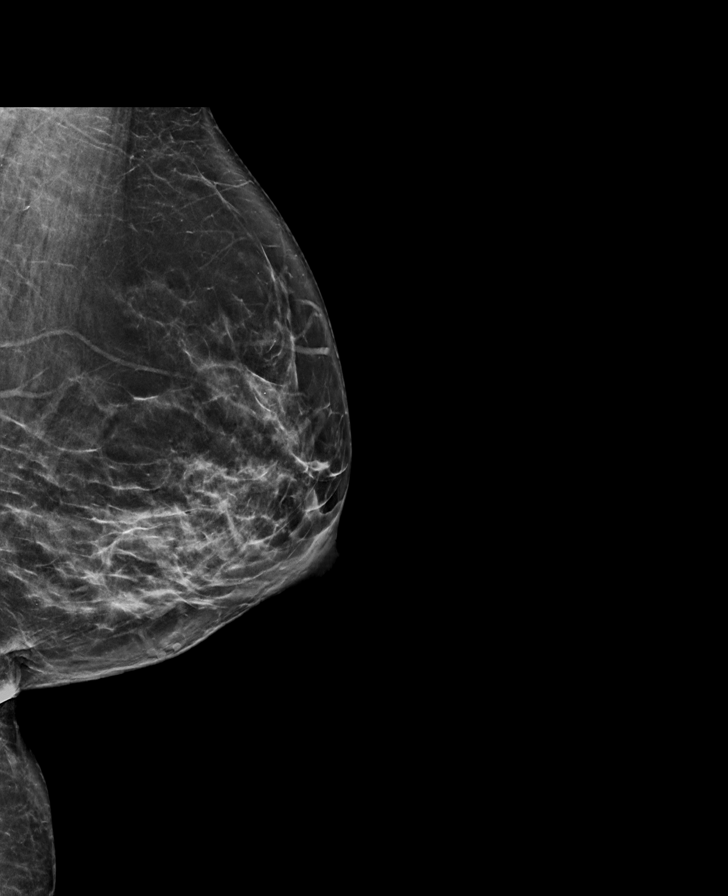

[R MLO tomo · tomo slice 35/70.0]
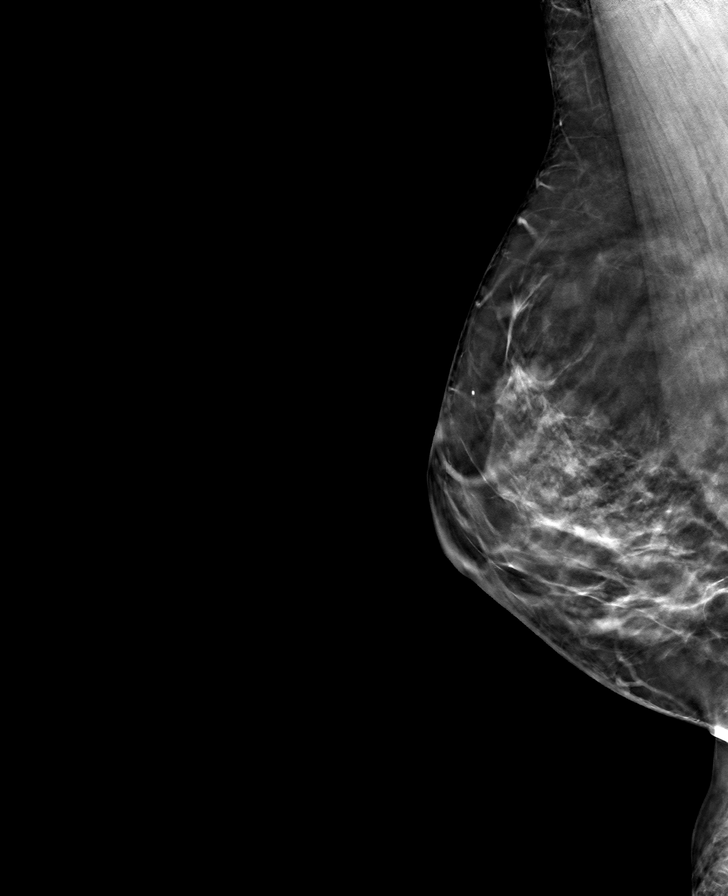

[L MLO tomo · tomo slice 36/71.0]
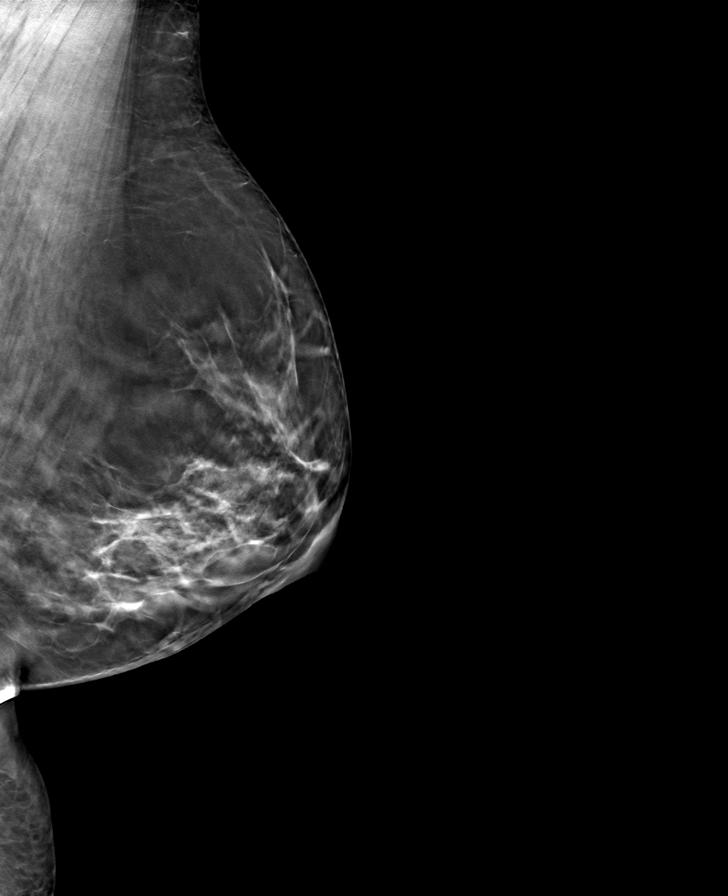

[R CC tomo · tomo slice 41/80.0]
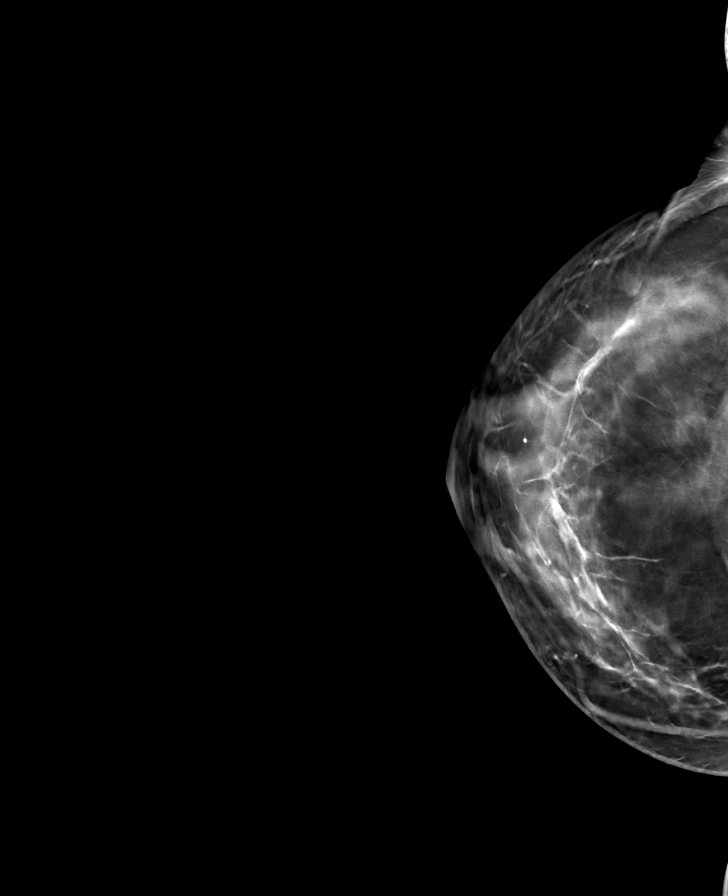

[L CC tomo · tomo slice 37/73.0]
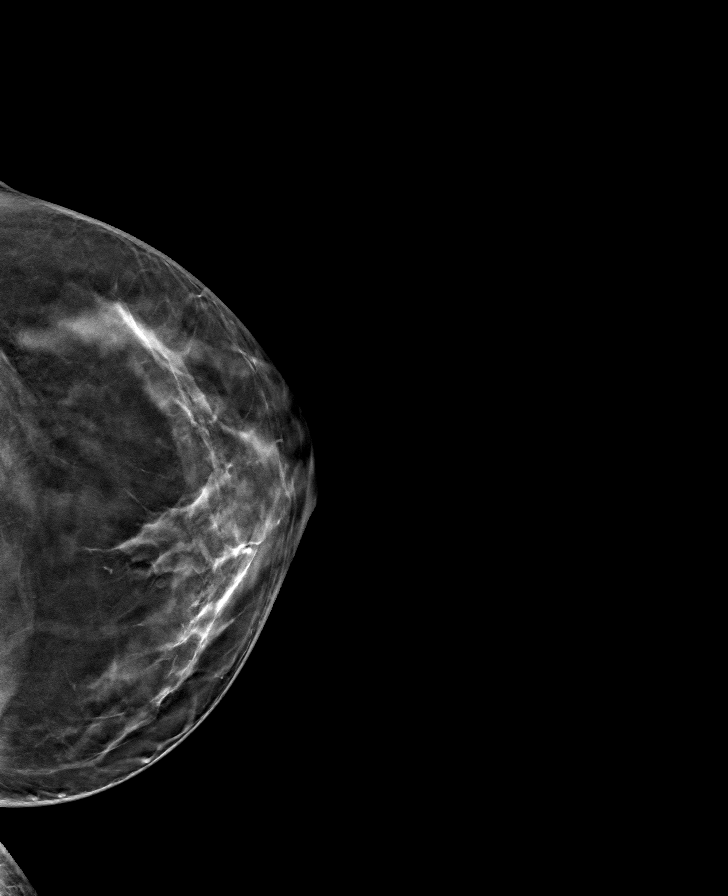

[8 of 24 positions shown; findings below may reference images not displayed]

ACR Breast Density Category c: The breast tissue is heterogeneously
dense, which may obscure small masses
FINDINGS: There are no findings suspicious for malignancy. The images were
evaluated with computer-aided detection.
IMPRESSION: No mammographic evidence of malignancy. A result letter of this
screening mammogram will be mailed directly to the patient.

RECOMMENDATION:
Screening mammogram in one year. (Code:CQ-1-UDH)

BI-RADS CATEGORY  1: Negative.

## 2021-08-29 ENCOUNTER — Ambulatory Visit: Payer: 59 | Admitting: Family

## 2021-08-29 ENCOUNTER — Ambulatory Visit
Admission: RE | Admit: 2021-08-29 | Discharge: 2021-08-29 | Disposition: A | Discharge status: Home / Self Care | Payer: 59 | Source: Ambulatory Visit | Attending: Family | Admitting: Family

## 2021-08-29 ENCOUNTER — Ambulatory Visit: Payer: 59 | Attending: Internal Medicine

## 2021-08-29 DIAGNOSIS — Z1231 Encounter for screening mammogram for malignant neoplasm of breast: Secondary | ICD-10-CM

## 2021-08-29 DIAGNOSIS — Z23 Encounter for immunization: Secondary | ICD-10-CM

## 2021-08-29 NOTE — Progress Notes (Signed)
   Covid-19 Vaccination Clinic  Name:  Sara Finley    MRN: [id number] DOB: Feb 15, 1981  08/29/2021  [name] was observed post Covid-19 immunization for 15 minutes without incident. She was provided with Vaccine Information Sheet and instruction to access the V-Safe system.   [name] was instructed to call 911 with any severe reactions post vaccine: Difficulty breathing  Swelling of face and throat  A fast heartbeat  A bad rash all over body  Dizziness and weakness   Immunizations Administered     Name Date Dose VIS Date Route   Pfizer Covid-19 Vaccine Bivalent Booster 08/29/2021 11:24 AM 0.3 mL 10/27/2020 Intramuscular   Manufacturer: [employer], [employer]   Lot: [id number]   NDC: [phone]

## 2021-09-01 ENCOUNTER — Encounter: Payer: Self-pay | Admitting: Family

## 2021-09-09 ENCOUNTER — Ambulatory Visit: Payer: 59 | Admitting: Family

## 2021-09-09 ENCOUNTER — Other Ambulatory Visit (HOSPITAL_BASED_OUTPATIENT_CLINIC_OR_DEPARTMENT_OTHER): Payer: Self-pay

## 2021-09-09 ENCOUNTER — Encounter: Payer: Self-pay | Admitting: Family

## 2021-09-09 VITALS — BP 108/62 | HR 86 | Temp 98.2°F | Ht 64.0 in | Wt 194.1 lb

## 2021-09-09 DIAGNOSIS — N760 Acute vaginitis: Secondary | ICD-10-CM

## 2021-09-09 DIAGNOSIS — E559 Vitamin D deficiency, unspecified: Secondary | ICD-10-CM

## 2021-09-09 DIAGNOSIS — B9689 Other specified bacterial agents as the cause of diseases classified elsewhere: Secondary | ICD-10-CM | POA: Diagnosis not present

## 2021-09-09 DIAGNOSIS — E669 Obesity, unspecified: Secondary | ICD-10-CM

## 2021-09-09 DIAGNOSIS — E119 Type 2 diabetes mellitus without complications: Secondary | ICD-10-CM

## 2021-09-09 LAB — BASIC METABOLIC PANEL
BUN: 15 mg/dL (ref 6–23)
CO2: 25 mEq/L (ref 19–32)
Calcium: 9.1 mg/dL (ref 8.4–10.5)
Chloride: 103 mEq/L (ref 96–112)
Creatinine, Ser: 0.88 mg/dL (ref 0.40–1.20)
GFR: 81.59 mL/min (ref >60.00)
Glucose, Bld: 162 mg/dL — ABNORMAL HIGH (ref 70–99)
Potassium: 3.9 mEq/L (ref 3.5–5.1)
Sodium: 135 mEq/L (ref 135–145)

## 2021-09-09 LAB — HEMOGLOBIN A1C: Hgb A1c MFr Bld: 6.3 % (ref 4.6–6.5)

## 2021-09-09 MED ORDER — METRONIDAZOLE 0.75 % VA GEL
1.0000 | Freq: Every day | VAGINAL | 0 refills | Status: AC
  Filled 2021-09-09: qty 70, 7d supply, fill #0

## 2021-09-09 MED ORDER — TIRZEPATIDE 2.5 MG/0.5ML ~~LOC~~ SOAJ
2.5000 mg | SUBCUTANEOUS | 0 refills | Status: DC
  Filled 2021-09-09: qty 2, 28d supply, fill #0

## 2021-09-09 NOTE — Progress Notes (Signed)
Subjective:   By signing my name below, I, Cassell Clement, attest that this documentation has been prepared under the direction and in the presence of Alma Downs' Su[place], NP 09/09/2021   Patient ID: Sara Finley, female    DOB: [birth year], 41 y.o.   MRN: [id number]  Chief Complaint  Patient presents with   Follow-up    HPI Patient is in today for an office visit.  Blood Sugar - Her A1C is consistently around 6.5. She states that initially she developed symptoms while taking two tablets of 500 Mg of Metformin. She then switched to one tablet until symptoms improved and then back to two tablets a day. She reports that she is no longer experiencing symptoms. She reports that her diet is improving and she is decreasing her consumption of carbohydrates. She went on vacation last week and reports that her diet worsened but has since been improving. She also has been improving in exercise, she reports that she goes to the [facility] about 3 times a day.  Lab Results  Component Value Date   HGBA1C 6.5 05/09/2021   Weight Loss - She is interested in trying Mounjaro injections. She states that she struggles with acid reflux and occasional constipation. She expresses concern about the possible GI symptoms that the injections might cause. She states that she is working out consistently but is not seeing a consistent decrease in weight. She also states that taking Metformin ,however, does curve her appetite. Wt Readings from Last 3 Encounters:  09/09/21 194 lb 2 oz (88.1 kg)  05/09/21 195 lb (88.5 kg)  12/31/20 193 lb (87.5 kg)   Vitamin D3 - She states that she is taking vitamin D3 supplements.    Bacterial Vaginosis - She is requesting to be prescribed Metronidazole gel. She states that there is an odor. She states that these symptoms usually arise after finishing her menstrual cycle. She reports that she recently finished her menstrual cycle    There are no preventive care reminders to display  for this patient.  Past Medical History:  Diagnosis Date   Abnormal Pap smear of cervix 2010   + HPV. normal pap since    GERD (gastroesophageal reflux disease)    Hyperglycemia    Prediabetes    Stress incontinence    Thyroid nodule    Type 2 diabetes mellitus without complications (HCC) 01/09/2014   A1C 6.0 8/15    Past Surgical History:  Procedure Laterality Date   APPENDECTOMY     BREAST ENHANCEMENT SURGERY Bilateral    2010-saline   BREAST IMPLANT REMOVAL Bilateral    pt states summer of  2021, removed   COLPOSCOPY  ~2010   Normal   UMBILICAL HERNIA REPAIR  @ age 37    Family History  Problem Relation Age of Onset   Hypertension Mother    Hyperlipidemia Mother    Diabetes Mellitus II Mother    Hypertension Paternal Grandmother    Stroke Paternal Grandmother    Hypertension Father    Hyperlipidemia Father    Diabetes Father        ?pre-diabetes   Benign prostatic hyperplasia Father    Sleep apnea Father    Asthma Father    Cataracts Father    Hypertension Paternal Uncle    Heart disease Paternal Uncle 60       MI/CABG x 4- died after failed bipass   Sleep apnea Brother    Anemia Sister        hyperglycemia  Sickle cell trait Sister    Goiter Sister     Social History   Socioeconomic History   Marital status: Divorced    Spouse name: Not on file   Number of children: Not on file   Years of education: Not on file   Highest education level: Not on file  Occupational History   Not on file  Tobacco Use   Smoking status: Never   Smokeless tobacco: Never  Vaping Use   Vaping Use: Never used  Substance and Sexual Activity   Alcohol use: Not Currently   Drug use: No   Sexual activity: Not Currently    Birth control/protection: Abstinence  Other Topics Concern   Not on file  Social History Narrative   1 daughter born 06/17/13   Married   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[employer]   Enjoys exercise, dancing, hiking, music, reading   Grew up  in [place], moved here 19 for school, some family is here and some back home   Social Determinants of Corporate investment banker Strain: Not on file  Food Insecurity: Not on file  Transportation Needs: Not on file  Physical Activity: Not on file  Stress: Not on file  Social Connections: Not on file  Intimate Partner Violence: Not on file    Outpatient Medications Prior to Visit  Medication Sig Dispense Refill   Cholecalciferol (VITAMIN D) 50 MCG (2000 UT) tablet Take 2,000 Units by mouth daily.     metFORMIN (GLUCOPHAGE) 500 MG tablet Take 1 tablet (500 mg total) by mouth 2 (two) times daily with a meal. 180 tablet 1   Multiple Vitamin (MULTIVITAMIN WITH MINERALS) TABS tablet Take 1 tablet by mouth daily.     No facility-administered medications prior to visit.    Allergies  Allergen Reactions   Asa [Aspirin] Other (See Comments)    gastriitis   Belviq [Lorcaserin Hcl] Other (See Comments)    Dizziness, nausea    Review of Systems  Genitourinary:        (+) Abnormal Odor       Objective:    Physical Exam Constitutional:      General: She is not in acute distress.    Appearance: Normal appearance. She is not ill-appearing.  HENT:     Head: Normocephalic and atraumatic.     Right Ear: External ear normal.     Left Ear: External ear normal.  Eyes:     Extraocular Movements: Extraocular movements intact.     Pupils: Pupils are equal, round, and reactive to light.  Neck:     Thyroid: No thyromegaly.  Cardiovascular:     Rate and Rhythm: Normal rate and regular rhythm.     Heart sounds: Normal heart sounds. No murmur heard.    No gallop.  Pulmonary:     Effort: Pulmonary effort is normal. No respiratory distress.     Breath sounds: Normal breath sounds. No wheezing or rales.  Lymphadenopathy:     Cervical: No cervical adenopathy.  Skin:    General: Skin is warm and dry.  Neurological:     Mental Status: She is alert and oriented to person, place, and time.   Psychiatric:        Mood and Affect: Mood normal.        Behavior: Behavior normal.        Judgment: Judgment normal.     BP 108/62   Pulse 86   Temp 98.2 F (36.8  C) (Oral)   Ht 5\' 4"  (1.626 m)   Wt 194 lb 2 oz (88.1 kg)   LMP 08/21/2021 (Approximate)   SpO2 98%   BMI 33.32 kg/m  Wt Readings from Last 3 Encounters:  09/09/21 194 lb 2 oz (88.1 kg)  05/09/21 195 lb (88.5 kg)  12/31/20 193 lb (87.5 kg)       Assessment & Plan:   Problem List Items Addressed This Visit       Unprioritized   Vitamin D deficiency    Last vitamin D level was normal. Continue otc vitamin D supplement.       Type 2 diabetes mellitus without complications [facility])    Lab Results  Component Value Date   HGBA1C 6.5 05/09/2021   HGBA1C 6.5 11/02/2020   HGBA1C 6.3 04/19/2020   Lab Results  Component Value Date   MICROALBUR <0.7 05/09/2021   LDLCALC 88 05/09/2021   CREATININE 0.75 05/09/2021   Wt Readings from Last 3 Encounters:  09/09/21 194 lb 2 oz (88.1 kg)  05/09/21 195 lb (88.5 kg)  12/31/20 193 lb (87.5 kg)  Will give trial of Mounjaro once weekly to help with A1C and weight loss.  Advised her OK to cut back metformin to once daily.       Relevant Medications   tirzepatide (MOUNJARO) 2.5 MG/0.5ML Pen   Other Relevant Orders   Hemoglobin A1c   Basic metabolic panel   Obesity (BMI 13/04/22.9)    Continue healthy diet/exercise/weight loss efforts.  She will let me know how she does in terms of side effects with Mounjaro.        Bacterial vaginosis - Primary    New. Rx sent for metrogel.          Meds ordered this encounter  Medications   metroNIDAZOLE (METROGEL VAGINAL) 0.75 % vaginal gel    Sig: Place 1 Applicatorful vaginally at bedtime for 5 days.    Dispense:  70 g    Refill:  0    Order Specific Question:   Supervising Provider    Answer:   46.6-59 A [[id number]]   tirzepatide (MOUNJARO) 2.5 MG/0.5ML Pen    Sig: Inject 2.5 mg into the skin once a week.     Dispense:  2 mL    Refill:  0    Order Specific Question:   Supervising Provider    Answer:   Danise Edge A [[id number]]    I, Danise Edge, NP, personally preformed the services described in this documentation.  All medical record entries made by the scribe were at my direction and in my presence.  I have reviewed the chart and discharge instructions (if applicable) and agree that the record reflects my personal performance and is accurate and complete. 09/09/2021   I,Amber Collins,acting as a 09/11/2021 for Neurosurgeon, NP.,have documented all relevant documentation on the behalf of Lemont Fillers, NP,as directed by  Lemont Fillers, NP while in the presence of Lemont Fillers, NP.   Lemont Fillers, NP

## 2021-09-09 NOTE — Patient Instructions (Signed)
Please complete lab work prior to leaving. Begin mounjaro.  Send me an update in 3-4 after starting mounjaro and let me know how you are doing. You can drop the metformin down to one tab daily while you are taking Mounjaro.

## 2021-09-09 NOTE — Assessment & Plan Note (Signed)
Last vitamin D level was normal. Continue otc vitamin D supplement.

## 2021-09-09 NOTE — Assessment & Plan Note (Addendum)
Lab Results  Component Value Date   HGBA1C 6.5 05/09/2021   HGBA1C 6.5 11/02/2020   HGBA1C 6.3 04/19/2020   Lab Results  Component Value Date   MICROALBUR <0.7 05/09/2021   LDLCALC 88 05/09/2021   CREATININE 0.75 05/09/2021   Wt Readings from Last 3 Encounters:  09/09/21 194 lb 2 oz (88.1 kg)  05/09/21 195 lb (88.5 kg)  12/31/20 193 lb (87.5 kg)   Will give trial of Mounjaro once weekly to help with A1C and weight loss.  Advised her OK to cut back metformin to once daily.

## 2021-09-09 NOTE — Assessment & Plan Note (Signed)
Continue healthy diet/exercise/weight loss efforts.  She will let me know how she does in terms of side effects with Mounjaro.

## 2021-09-09 NOTE — Assessment & Plan Note (Signed)
New. Rx sent for metrogel.

## 2021-09-13 ENCOUNTER — Other Ambulatory Visit (HOSPITAL_BASED_OUTPATIENT_CLINIC_OR_DEPARTMENT_OTHER): Payer: Self-pay

## 2021-09-13 MED ORDER — PFIZER COVID-19 VAC BIVALENT 30 MCG/0.3ML IM SUSP
INTRAMUSCULAR | 0 refills | Status: DC
  Filled 2021-09-13 (×2): qty 0.5, 1d supply, fill #0

## 2021-09-24 ENCOUNTER — Encounter: Payer: Self-pay | Admitting: Family

## 2021-09-26 ENCOUNTER — Other Ambulatory Visit (HOSPITAL_BASED_OUTPATIENT_CLINIC_OR_DEPARTMENT_OTHER): Payer: Self-pay

## 2021-09-26 MED ORDER — TIRZEPATIDE 5 MG/0.5ML ~~LOC~~ SOAJ
5.0000 mg | SUBCUTANEOUS | 0 refills | Status: DC
  Filled 2021-09-26: qty 6, 84d supply, fill #0

## 2021-10-12 ENCOUNTER — Other Ambulatory Visit (HOSPITAL_BASED_OUTPATIENT_CLINIC_OR_DEPARTMENT_OTHER): Payer: Self-pay

## 2021-12-09 ENCOUNTER — Ambulatory Visit (INDEPENDENT_AMBULATORY_CARE_PROVIDER_SITE_OTHER): Payer: 59 | Admitting: Family

## 2021-12-09 ENCOUNTER — Encounter: Payer: Self-pay | Admitting: Family

## 2021-12-09 ENCOUNTER — Other Ambulatory Visit (HOSPITAL_BASED_OUTPATIENT_CLINIC_OR_DEPARTMENT_OTHER): Payer: Self-pay

## 2021-12-09 VITALS — BP 97/67 | HR 71 | Temp 97.8°F | Resp 16 | Ht 64.0 in | Wt 181.0 lb

## 2021-12-09 DIAGNOSIS — E66811 Obesity, class 1: Secondary | ICD-10-CM

## 2021-12-09 DIAGNOSIS — Z0000 Encounter for general adult medical examination without abnormal findings: Secondary | ICD-10-CM

## 2021-12-09 DIAGNOSIS — E669 Obesity, unspecified: Secondary | ICD-10-CM | POA: Diagnosis not present

## 2021-12-09 DIAGNOSIS — Z23 Encounter for immunization: Secondary | ICD-10-CM | POA: Diagnosis not present

## 2021-12-09 DIAGNOSIS — E119 Type 2 diabetes mellitus without complications: Secondary | ICD-10-CM | POA: Diagnosis not present

## 2021-12-09 MED ORDER — TIRZEPATIDE 5 MG/0.5ML ~~LOC~~ SOAJ
5.0000 mg | SUBCUTANEOUS | 1 refills | Status: DC
Start: 2021-12-09 — End: 2022-05-22
  Filled 2021-12-09: qty 6, 84d supply, fill #0
  Filled 2022-02-15 – 2022-02-22 (×2): qty 2, 28d supply, fill #1
  Filled 2022-03-19: qty 2, 28d supply, fill #2
  Filled 2022-03-21: qty 4, 56d supply, fill #2

## 2021-12-09 NOTE — Assessment & Plan Note (Signed)
Weight is coming down nicely with Mounjaro.

## 2021-12-09 NOTE — Assessment & Plan Note (Signed)
Lab Results  Component Value Date   HGBA1C 6.3 09/09/2021   HGBA1C 6.5 05/09/2021   HGBA1C 6.5 11/02/2020   Lab Results  Component Value Date   MICROALBUR <0.7 05/09/2021   LDLCALC 88 05/09/2021   CREATININE 0.88 09/09/2021   Suspect A1C to be down. She will continue metformin once daily and mounjaro 5mg  once weekly.

## 2021-12-09 NOTE — Progress Notes (Signed)
Subjective:     Patient ID: Sara Finley, female    DOB: 1981/01/30, 41 y.o.   MRN: [id number]  Chief Complaint  Patient presents with   Annual Exam    HPI  Patient presents today for complete physical.  Immunizations:  Flu shot today. Tetanus 2018 Diet: healthy Exercise: 3 times a week Working on weight loss Pap Smear:  11/22 Mammogram:  7/23 Vision: up to date Dental: up to date  DM2- She is using metformin once daily in the AM. If she takes in PM sugar was dropping into the 70's.   Wt Readings from Last 3 Encounters:  12/09/21 181 lb (82.1 kg)  09/09/21 194 lb 2 oz (88.1 kg)  05/09/21 195 lb (88.5 kg)    Lab Results  Component Value Date   HGBA1C 6.3 09/09/2021   HGBA1C 6.5 05/09/2021   HGBA1C 6.5 11/02/2020   Lab Results  Component Value Date   MICROALBUR <0.7 05/09/2021   [place] 88 05/09/2021   CREATININE 0.88 09/09/2021   Vit D deficiency-    There are no preventive care reminders to display for this patient.   Past Medical History:  Diagnosis Date   Abnormal Pap smear of cervix 2010   + HPV. normal pap since    GERD (gastroesophageal reflux disease)    Hyperglycemia    Prediabetes    Stress incontinence    Thyroid nodule    Type 2 diabetes mellitus without complications ([place]) 65/53/7482   A1C 6.0 8/15    Past Surgical History:  Procedure Laterality Date   APPENDECTOMY     BREAST ENHANCEMENT SURGERY Bilateral    2010-saline   BREAST IMPLANT REMOVAL Bilateral    pt states summer of  2021, removed   COLPOSCOPY  ~7078   Normal   UMBILICAL HERNIA REPAIR  @ age 36    Family History  Problem Relation Age of Onset   Hypertension Mother    Hyperlipidemia Mother    Diabetes Mellitus II Mother    Hypertension Paternal Grandmother    Stroke Paternal Grandmother    Hypertension Father    Hyperlipidemia Father    Diabetes Father        ?pre-diabetes   Benign prostatic hyperplasia Father    Sleep apnea Father    Asthma Father     Cataracts Father    Hypertension Paternal Uncle    Heart disease Paternal Uncle 106       MI/CABG x 4- died after failed bipass   Sleep apnea Brother    Anemia Sister        hyperglycemia   Sickle cell trait Sister    Goiter Sister     Social History   Socioeconomic History   Marital status: Divorced    Spouse name: Not on file   Number of children: Not on file   Years of education: Not on file   Highest education level: Not on file  Occupational History   Not on file  Tobacco Use   Smoking status: Never   Smokeless tobacco: Never  Vaping Use   Vaping Use: Never used  Substance and Sexual Activity   Alcohol use: Not Currently   Drug use: No   Sexual activity: Not Currently    Birth control/protection: Abstinence  Other Topics Concern   Not on file  Social History Narrative   1 daughter born 06/17/13   Married   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     COVID-19 mRNA bivalent vaccine, Pfizer, (PFIZER COVID-19 VAC BIVALENT) injection Inject into the muscle. 0.5 mL 0   metFORMIN (GLUCOPHAGE) 500 MG tablet Take 1 tablet (500 mg total) by mouth 2 (two) times daily with a meal. 180 tablet 1   Multiple Vitamin (MULTIVITAMIN WITH MINERALS) TABS tablet Take 1 tablet by mouth daily.     tirzepatide [facility]) 5 MG/0.5ML Pen Inject 5 mg into the skin once a week. 6 mL 0   No facility-administered medications  prior to visit.    Allergies  Allergen Reactions   Asa [Aspirin] Other (See Comments)    gastriitis   Belviq [Lorcaserin Hcl] Other (See Comments)    Dizziness, nausea    Review of Systems  Constitutional:  Positive for weight loss.  HENT:  Negative for congestion and hearing loss.   Eyes:  Negative for blurred vision.  Respiratory:  Negative for cough.   Cardiovascular:  Negative for leg swelling.  Gastrointestinal:  Positive for constipation.  Genitourinary:  Negative for dysuria and frequency.  Musculoskeletal:  Negative for joint pain and myalgias.  Skin:  Negative for rash.  Neurological:  Negative for headaches.  Psychiatric/Behavioral:         Denies depression/anxiety       Objective:    Physical Exam  BP 97/67 (BP Location: Right Arm, Patient Position: Sitting, Cuff Size: Small)   Pulse 71   Temp 97.8 F (36.6 C) (Oral)   Resp 16   Ht _0  (1.626 m)   Wt 181 lb (82.1 kg)   SpO2 100%   BMI 31.07 kg/m  Wt Readings from Last 3 Encounters:  12/09/21 181 lb (82.1 kg)  09/09/21 194 lb 2 oz (88.1 kg)  05/09/21 195 lb (88.5 kg)  Physical Exam  Constitutional: She is oriented to person, place, and time. She appears well-developed and well-nourished. No distress.  HENT:  Head: Normocephalic and atraumatic.  Right Ear: Tympanic membrane and ear canal normal.  Left Ear: Tympanic membrane and ear canal normal.  Mouth/Throat: clear Eyes: Pupils are equal, round, and reactive to light. No scleral icterus.  Neck: Normal range of motion. No thyromegaly present.  Cardiovascular: Normal rate and regular rhythm.   No murmur heard. Pulmonary/Chest: Effort normal and breath sounds normal. No respiratory distress. He has no wheezes. She has no rales. She exhibits no tenderness.  Abdominal: Soft. Bowel sounds are normal. She exhibits no distension and no mass. There is no tenderness. There is no rebound and no guarding.  Musculoskeletal: She exhibits no edema.   Lymphadenopathy:    She has no cervical adenopathy.  Neurological: She is alert and oriented to person, place, and time. She has normal patellar reflexes. She exhibits normal muscle tone. Coordination normal.  Skin: Skin is warm and dry.  Psychiatric: She has a normal mood and affect. Her behavior is normal. Judgment and thought content normal.  Breast/pelvic: deferred           Assessment & Plan:        Assessment & Plan:   Problem List Items Addressed This Visit       Unprioritized   Type 2 diabetes mellitus without complications ([place])    Lab Results  Component Value Date   HGBA1C 6.3 09/09/2021   HGBA1C 6.5 05/09/2021   HGBA1C 6.5 11/02/2020   Lab Results  Component Value Date   MICROALBUR <0.7 05/09/2021   [place] 88 05/09/2021   CREATININE 0.88 09/09/2021  Suspect A1C to be down. She will continue metformin once daily and mounjaro 58m once weekly.       Relevant Medications   tirzepatide (MOUNJARO) 5 MG/0.5ML Pen   Other Relevant Orders   Hemoglobin A1c   Comp Met (CMET)   Preventative health care - Primary    Continue healthy diet, exercise, weight loss efforts. Pap/mammo up to date. Flu shot today. Tetanus up to date.       Obesity (BMI 30.0-34.9)    Weight is coming down nicely with Mounjaro.       Other Visit Diagnoses     Needs flu shot       Relevant Orders   Flu Vaccine QUAD 6+ mos PF IM (Fluarix Quad PF) (Completed)       I am having Akaila Wingrove maintain her multivitamin with minerals, Vitamin D, metFORMIN, Pfizer COVID-19 Vac Bivalent, and tirzepatide.  Meds ordered this encounter  Medications   tirzepatide (MOUNJARO) 5 MG/0.5ML Pen    Sig: Inject 5 mg into the skin once a week.    Dispense:  6 mL    Refill:  1    Order Specific Question:   Supervising Provider    Answer:   BPenni HomansA [[id number]]

## 2021-12-09 NOTE — Assessment & Plan Note (Signed)
Continue healthy diet, exercise, weight loss efforts. Pap/mammo up to date. Flu shot today. Tetanus up to date.

## 2021-12-12 ENCOUNTER — Other Ambulatory Visit (INDEPENDENT_AMBULATORY_CARE_PROVIDER_SITE_OTHER): Payer: 59

## 2021-12-12 DIAGNOSIS — E119 Type 2 diabetes mellitus without complications: Secondary | ICD-10-CM | POA: Diagnosis not present

## 2021-12-12 LAB — HEMOGLOBIN A1C: Hgb A1c MFr Bld: 6 % (ref 4.6–6.5)

## 2021-12-12 LAB — COMPREHENSIVE METABOLIC PANEL
ALT: 7 U/L (ref 0–35)
AST: 14 U/L (ref 0–37)
Albumin: 4.1 g/dL (ref 3.5–5.2)
Alkaline Phosphatase: 62 U/L (ref 39–117)
BUN: 8 mg/dL (ref 6–23)
CO2: 27 mEq/L (ref 19–32)
Calcium: 9.1 mg/dL (ref 8.4–10.5)
Chloride: 102 mEq/L (ref 96–112)
Creatinine, Ser: 0.76 mg/dL (ref 0.40–1.20)
GFR: 97.1 mL/min (ref >60.00)
Glucose, Bld: 75 mg/dL (ref 70–99)
Potassium: 4.1 mEq/L (ref 3.5–5.1)
Sodium: 137 mEq/L (ref 135–145)
Total Bilirubin: 0.3 mg/dL (ref 0.2–1.2)
Total Protein: 7 g/dL (ref 6.0–8.3)

## 2021-12-12 NOTE — Addendum Note (Signed)
Addended by: Manuela Schwartz on: 12/12/2021 12:30 PM   Modules accepted: Orders

## 2022-02-15 ENCOUNTER — Other Ambulatory Visit (HOSPITAL_BASED_OUTPATIENT_CLINIC_OR_DEPARTMENT_OTHER): Payer: Self-pay

## 2022-02-22 ENCOUNTER — Other Ambulatory Visit (HOSPITAL_BASED_OUTPATIENT_CLINIC_OR_DEPARTMENT_OTHER): Payer: Self-pay

## 2022-03-21 ENCOUNTER — Other Ambulatory Visit (HOSPITAL_BASED_OUTPATIENT_CLINIC_OR_DEPARTMENT_OTHER): Payer: Self-pay

## 2022-04-14 LAB — HM DIABETES EYE EXAM

## 2022-05-22 ENCOUNTER — Other Ambulatory Visit: Payer: Self-pay | Admitting: Family

## 2022-05-23 ENCOUNTER — Other Ambulatory Visit (HOSPITAL_BASED_OUTPATIENT_CLINIC_OR_DEPARTMENT_OTHER): Payer: Self-pay

## 2022-05-23 MED ORDER — MOUNJARO 5 MG/0.5ML ~~LOC~~ SOAJ
5.0000 mg | SUBCUTANEOUS | 1 refills | Status: DC
Start: 2022-05-23 — End: 2022-12-02
  Filled 2022-05-23: qty 6, 84d supply, fill #0
  Filled 2022-07-29: qty 6, 84d supply, fill #1
  Filled 2022-08-03: qty 2, 28d supply, fill #1
  Filled 2022-09-12 – 2022-09-19 (×2): qty 2, 28d supply, fill #2
  Filled 2022-10-19: qty 2, 28d supply, fill #3

## 2022-05-25 ENCOUNTER — Encounter: Payer: Self-pay | Admitting: Nurse Practitioner

## 2022-05-25 ENCOUNTER — Other Ambulatory Visit (INDEPENDENT_AMBULATORY_CARE_PROVIDER_SITE_OTHER): Payer: Commercial Managed Care - PPO

## 2022-05-25 ENCOUNTER — Ambulatory Visit: Payer: Commercial Managed Care - PPO | Admitting: Nurse Practitioner

## 2022-05-25 ENCOUNTER — Encounter: Payer: Self-pay | Admitting: Family

## 2022-05-25 ENCOUNTER — Ambulatory Visit (INDEPENDENT_AMBULATORY_CARE_PROVIDER_SITE_OTHER): Payer: Commercial Managed Care - PPO

## 2022-05-25 VITALS — BP 98/60 | HR 88 | Temp 98.0°F | Resp 16 | Wt 169.8 lb

## 2022-05-25 DIAGNOSIS — K921 Melena: Secondary | ICD-10-CM

## 2022-05-25 DIAGNOSIS — K922 Gastrointestinal hemorrhage, unspecified: Secondary | ICD-10-CM | POA: Diagnosis not present

## 2022-05-25 DIAGNOSIS — K219 Gastro-esophageal reflux disease without esophagitis: Secondary | ICD-10-CM

## 2022-05-25 DIAGNOSIS — K625 Hemorrhage of anus and rectum: Secondary | ICD-10-CM | POA: Insufficient documentation

## 2022-05-25 LAB — SEDIMENTATION RATE: Sed Rate: 39 mm/hr — ABNORMAL HIGH (ref 0–20)

## 2022-05-25 LAB — COMPREHENSIVE METABOLIC PANEL
ALT: 8 U/L (ref 0–35)
AST: 15 U/L (ref 0–37)
Albumin: 4.1 g/dL (ref 3.5–5.2)
Alkaline Phosphatase: 55 U/L (ref 39–117)
BUN: 10 mg/dL (ref 6–23)
CO2: 27 mEq/L (ref 19–32)
Calcium: 8.9 mg/dL (ref 8.4–10.5)
Chloride: 106 mEq/L (ref 96–112)
Creatinine, Ser: 0.85 mg/dL (ref 0.40–1.20)
GFR: 84.63 mL/min (ref >60.00)
Glucose, Bld: 84 mg/dL (ref 70–99)
Potassium: 4.2 mEq/L (ref 3.5–5.1)
Sodium: 137 mEq/L (ref 135–145)
Total Bilirubin: 0.5 mg/dL (ref 0.2–1.2)
Total Protein: 7 g/dL (ref 6.0–8.3)

## 2022-05-25 LAB — CBC WITH DIFFERENTIAL/PLATELET
Basophils Absolute: 0.1 10*3/uL (ref 0.0–0.1)
Basophils Relative: 1.4 % (ref 0.0–3.0)
Eosinophils Absolute: 0.1 10*3/uL (ref 0.0–0.7)
Eosinophils Relative: 1.5 % (ref 0.0–5.0)
HCT: 38.1 % (ref 36.0–46.0)
Hemoglobin: 12.4 g/dL (ref 12.0–15.0)
Lymphocytes Relative: 35.9 % (ref 12.0–46.0)
Lymphs Abs: 1.7 10*3/uL (ref 0.7–4.0)
MCHC: 32.6 g/dL (ref 30.0–36.0)
MCV: 84.8 fl (ref 78.0–100.0)
Monocytes Absolute: 0.4 10*3/uL (ref 0.1–1.0)
Monocytes Relative: 9.6 % (ref 3.0–12.0)
Neutro Abs: 2.4 10*3/uL (ref 1.4–7.7)
Neutrophils Relative %: 51.6 % (ref 43.0–77.0)
Platelets: 299 10*3/uL (ref 150.0–400.0)
RBC: 4.49 Mil/uL (ref 3.87–5.11)
RDW: 14 % (ref 11.5–15.5)
WBC: 4.6 10*3/uL (ref 4.0–10.5)

## 2022-05-25 MED ORDER — IOHEXOL 300 MG/ML  SOLN
100.0000 mL | Freq: Once | INTRAMUSCULAR | Status: AC | PRN
  Administered 2022-05-25: 100 mL via INTRAVENOUS

## 2022-05-25 NOTE — Progress Notes (Addendum)
Acute Office Visit  Subjective:     Patient ID: Sara Finley, female    DOB: [birth year], 42 y.o.   MRN: [id number]  Chief Complaint  Patient presents with   Rectal Bleeding    This morning    HPI Patient is in today for bright red blood in the toilet with having a bowel movement this morning. She states then the blood was dripping after her bowel movement. She waited a few minutes and sat back down and started bleeding more. She then urinated a few hours later and noted blood on the toilet tissue. She is not on her menstrual period. She has some bloating in her pelvis and low back aching that started yesterday. She denies abdominal pain and cramping. She does not take any NSAIDs or aspirin. She did have chills last night, however is always cold. She denies fevers, chest pain, shortness of breath, and weight loss.   She is having ongoing acid reflux. She takes omeprazole 20-40mg  daily. She does have a history of h. pylori which was treated and retests were negative. She was going to discuss a referral to GI with her PCP at her next appointment.   ROS See pertinent positives and negatives per HPI.     Objective:    BP 98/60 (BP Location: Right Arm, Patient Position: Sitting, Cuff Size: Large)   Pulse 88   Temp 98 F (36.7 C)   Resp 16   Wt 169 lb 12.8 oz (77 kg)   SpO2 98%   BMI 29.15 kg/m    Physical Exam Vitals and nursing note reviewed.  Constitutional:      General: She is not in acute distress.    Appearance: Normal appearance.  HENT:     Head: Normocephalic.  Eyes:     Conjunctiva/sclera: Conjunctivae normal.  Cardiovascular:     Rate and Rhythm: Normal rate and regular rhythm.     Pulses: Normal pulses.     Heart sounds: Normal heart sounds.  Pulmonary:     Effort: Pulmonary effort is normal.     Breath sounds: Normal breath sounds.  Abdominal:     General: Bowel sounds are normal. There is no distension.     Palpations: Abdomen is soft. There is no mass.      Tenderness: There is abdominal tenderness (LLQ, RUQ). There is no guarding or rebound.     Hernia: No hernia is present.  Genitourinary:    Rectum: Normal.  Musculoskeletal:     Cervical back: Normal range of motion.  Skin:    General: Skin is warm.  Neurological:     General: No focal deficit present.     Mental Status: She is alert and oriented to person, place, and time.  Psychiatric:        Mood and Affect: Mood normal.        Behavior: Behavior normal.        Thought Content: Thought content normal.        Judgment: Judgment normal.      Assessment & Plan:   Problem List Items Addressed This Visit       Digestive   GERD (gastroesophageal reflux disease)    Chronic worsening. She states that her GERD symptoms have been getting worse even with taking omeprazole 20-40mg  daily. Will place referral to GI.         Other   Blood in stool - Primary    She had rectal bleeding with a bowel movement this  morning. She states that she was still dripping blood after having the bowel movement. She does have LLQ and RUQ tenderness on palpation. She denies fevers, cramping, and constipation. She had slight blood on toilet tissue later today with urination. Will check stat CT abdomen pelvis, CMP, CBC, and iron panel. Referral placed to GI. Keep next scheduled appointment with PCP. ER precautions discussed.       Relevant Orders   CBC with Differential/Platelet   Comprehensive metabolic panel   Iron, TIBC and Ferritin Panel   CT ABDOMEN PELVIS W CONTRAST   Ambulatory referral to Gastroenterology    No orders of the defined types were placed in this encounter.   Return if symptoms worsen or fail to improve.  Charyl Dancer, NP

## 2022-05-25 NOTE — Addendum Note (Signed)
Addended by: Vance Peper A on: 05/25/2022 04:58 PM   Modules accepted: Orders

## 2022-05-25 NOTE — Patient Instructions (Signed)
It was great to see you!  We are checking stat labs today.   You have a CT ordered at 4pm today at [facility]. Drink 1 contrast bottle at 2pm and 1 contrast bottle at 3pm.   I have placed a referral to GI.   If you have further bleeding or increased pain, go to the ER  Let's follow-up with your PCP at your next scheduled visit, sooner if you have concerns.  If a referral was placed today, you will be contacted for an appointment. Please note that routine referrals can sometimes take up to 3-4 weeks to process. Please call our office if you haven't heard anything after this time frame.  Take care,  Vance Peper, NP

## 2022-05-25 NOTE — Assessment & Plan Note (Signed)
Chronic worsening. She states that her GERD symptoms have been getting worse even with taking omeprazole 20-40mg  daily. Will place referral to GI.

## 2022-05-25 NOTE — Assessment & Plan Note (Signed)
>>  ASSESSMENT AND PLAN FOR BLOOD IN STOOL WRITTEN ON 05/25/2022  1:07 PM BY MCELWEE, LAUREN A, NP  She had rectal bleeding with a bowel movement this morning. She states that she was still dripping blood after having the bowel movement. She does have LLQ and RUQ tenderness on palpation. She denies fevers, cramping, and constipation. She had slight blood on toilet tissue later today with urination. Will check stat CT abdomen pelvis, CMP, CBC, and iron panel. Referral placed to GI. Keep next scheduled appointment with PCP. ER precautions discussed.

## 2022-05-25 NOTE — Assessment & Plan Note (Signed)
She had rectal bleeding with a bowel movement this morning. She states that she was still dripping blood after having the bowel movement. She does have LLQ and RUQ tenderness on palpation. She denies fevers, cramping, and constipation. She had slight blood on toilet tissue later today with urination. Will check stat CT abdomen pelvis, CMP, CBC, and iron panel. Referral placed to GI. Keep next scheduled appointment with PCP. ER precautions discussed.

## 2022-05-26 LAB — IRON,TIBC AND FERRITIN PANEL
%SAT: 20 % (calc) (ref 16–45)
Ferritin: 54 ng/mL (ref 16–232)
Iron: 57 ug/dL (ref 40–190)
TIBC: 292 mcg/dL (calc) (ref 250–450)

## 2022-06-02 ENCOUNTER — Encounter: Payer: Self-pay | Admitting: Gastroenterology

## 2022-06-07 NOTE — Progress Notes (Signed)
Subjective:   By signing my name below, I, [name], attest that this documentation has been prepared under the direction and in the presence of Lemont Fillers, NP 06/11/22   Patient ID: Sara Finley, female    DOB: 1980/07/26, 42 y.o.   MRN: [id number]  Chief Complaint  Patient presents with   Diabetes    Here for follow up, discontinued metformin due to diarrhea    HPI Patient is in today for a 6 month follow up.   Diabetes: She reports an A1C of 5.3 about a month ago. She reports having lost about 30 lbs. She has stopped Metformin. She previously went 2 weeks without Mounjaro, when restarted it along with Metformin she experienced diarrhea.  Lab Results  Component Value Date   HGBA1C 6.0 12/12/2021   Wt Readings from Last 3 Encounters:  06/09/22 170 lb (77.1 kg)  05/25/22 169 lb 12.8 oz (77 kg)  12/09/21 181 lb (82.1 kg)   Rectal bleeding: She reports prior rectal bleeding. She states she noticed blood dripping after a bowel movement accompanied by a dull lower left abdominal pain. She endorses an occasional burning sensation in her lower left abdomen that worsens after a bowel movement, lasting about an hour. She had a CT abd/pelvis which noted:   There is mild diffuse wall thickening in rectosigmoid which may be an artifact due to incomplete distention or suggest inflammatory process. Less likely possibility would be a neoplastic process. When the patient's clinical condition permits, follow-up endoscopy should be considered.  She is scheduled to follow up with Dr. Barron Alvine on 08/17/2022 for GI consultation.  GERD: She takes 20-40 mg of Omeprazole to help with her acid reflux. She has cleaned up her diet well. She avoids fried food and tries to bake or air fry her food instead.  She does report that she had an accidental needle stick with a diabetic lancet when trying ot help a diabetic family member about 2 months ago.  Past Medical History:  Diagnosis  Date   Abnormal Pap smear of cervix 2010   + HPV. normal pap since    GERD (gastroesophageal reflux disease)    Hyperglycemia    Prediabetes    Stress incontinence    Thyroid nodule    Type 2 diabetes mellitus without complications (HCC) 01/09/2014   A1C 6.0 8/15    Past Surgical History:  Procedure Laterality Date   APPENDECTOMY     BREAST ENHANCEMENT SURGERY Bilateral    2010-saline   BREAST IMPLANT REMOVAL Bilateral    pt states summer of  2021, removed   COLPOSCOPY  ~2010   Normal   UMBILICAL HERNIA REPAIR  @ age 15    Family History  Problem Relation Age of Onset   Hypertension Mother    Hyperlipidemia Mother    Diabetes Mellitus II Mother    Hypertension Paternal Grandmother    Stroke Paternal Grandmother    Hypertension Father    Hyperlipidemia Father    Diabetes Father        ?pre-diabetes   Benign prostatic hyperplasia Father    Sleep apnea Father    Asthma Father    Cataracts Father    Hypertension Paternal Uncle    Heart disease Paternal Uncle 4       MI/CABG x 4- died after failed bipass   Sleep apnea Brother    Anemia Sister        hyperglycemia   Sickle cell trait Sister  Goiter Sister     Social History   Socioeconomic History   Marital status: Divorced    Spouse name: Not on file   Number of children: Not on file   Years of education: Not on file   Highest education level: Not on file  Occupational History   Not on file  Tobacco Use   Smoking status: Never   Smokeless tobacco: Never  Vaping Use   Vaping Use: Never used  Substance and Sexual Activity   Alcohol use: Not Currently   Drug use: No   Sexual activity: Not Currently    Birth control/protection: Abstinence  Other Topics Concern   Not on file  Social History Narrative   1 daughter born 06/17/13   Married   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[employer]   Enjoys exercise, dancing, hiking, music, reading   Grew up in [place], moved here 19 for school, some family is  here and some back home   Social Determinants of Corporate investment banker Strain: Not on file  Food Insecurity: Not on file  Transportation Needs: Not on file  Physical Activity: Not on file  Stress: Not on file  Social Connections: Not on file  Intimate Partner Violence: Not on file    Outpatient Medications Prior to Visit  Medication Sig Dispense Refill   Cholecalciferol (VITAMIN D) 50 MCG (2000 UT) tablet Take 2,000 Units by mouth daily.     COVID-19 mRNA bivalent vaccine, Pfizer, (PFIZER COVID-19 VAC BIVALENT) injection Inject into the muscle. 0.5 mL 0   Multiple Vitamin (MULTIVITAMIN WITH MINERALS) TABS tablet Take 1 tablet by mouth daily.     tirzepatide [facility]) 5 MG/0.5ML Pen Inject 5 mg into the skin once a week. 6 mL 1   metFORMIN (GLUCOPHAGE) 500 MG tablet Take 1 tablet (500 mg total) by mouth 2 (two) times daily with a meal. (Patient not taking: Reported on 06/09/2022) 180 tablet 1   No facility-administered medications prior to visit.    Allergies  Allergen Reactions   Asa [Aspirin] Other (See Comments)    gastriitis   Belviq [Lorcaserin Hcl] Other (See Comments)    Dizziness, nausea    Review of Systems  Gastrointestinal:  Positive for abdominal pain (+ burning sensation -- LLQ), blood in stool (rectal bleeding -- see HPI), diarrhea (attributed to Metformin) and heartburn.       Objective:    Physical Exam Constitutional:      General: She is not in acute distress.    Appearance: Normal appearance. She is well-developed.  HENT:     Head: Normocephalic and atraumatic.     Right Ear: External ear normal.     Left Ear: External ear normal.  Eyes:     General: No scleral icterus. Neck:     Thyroid: No thyromegaly.  Cardiovascular:     Rate and Rhythm: Normal rate and regular rhythm.     Heart sounds: Normal heart sounds. No murmur heard. Pulmonary:     Effort: Pulmonary effort is normal. No respiratory distress.     Breath sounds: Normal breath  sounds. No wheezing.  Musculoskeletal:     Cervical back: Neck supple.  Skin:    General: Skin is warm and dry.  Neurological:     Mental Status: She is alert and oriented to person, place, and time.  Psychiatric:        Mood and Affect: Mood normal.        Behavior: Behavior  normal.        Thought Content: Thought content normal.        Judgment: Judgment normal.     BP 104/73 (BP Location: Right Arm, Patient Position: Sitting, Cuff Size: Small)   Pulse 72   Temp 98 F (36.7 C) (Oral)   Resp 16   Wt 170 lb (77.1 kg)   SpO2 100%   BMI 29.18 kg/m  Wt Readings from Last 3 Encounters:  06/09/22 170 lb (77.1 kg)  05/25/22 169 lb 12.8 oz (77 kg)  12/09/21 181 lb (82.1 kg)       Assessment & Plan:  Rectal bleeding Assessment & Plan: Resolved, GI consult is pending.    Type 2 diabetes mellitus without complication, without long-term current use of insulin Assessment & Plan: Wt Readings from Last 3 Encounters:  06/09/22 170 lb (77.1 kg)  05/25/22 169 lb 12.8 oz (77 kg)  12/09/21 181 lb (82.1 kg)   Obtain follow up A1C.  Expect it to be very good given her weight loss. Continue mounjaro.   Orders: -     Hemoglobin A1c; Future -     Microalbumin / creatinine urine ratio; Future  Gastroesophageal reflux disease, unspecified whether esophagitis present Assessment & Plan: Suspect that mounjaro is contributing to hr symptoms. She continues omeprazole prn.  Offered to change her to daily prilosec but she declines as she wishes to try to minimize her medication use as much as possible.  She does have some dysphagia and probably could benefit from an endoscopy as well but will defer to GI.    Preventative health care -     Ambulatory referral to Obstetrics / Gynecology  Accident caused by hypodermic needle, initial encounter -     HIV Antibody (routine testing w rflx); Future -     Hepatitis C antibody; Future -     RPR; Future     I,Rachel Rivera,acting as a scribe  for Lemont FillersMelissa S O'Sullivan, NP.,have documented all relevant documentation on the behalf of Lemont FillersMelissa S O'Sullivan, NP,as directed by  Lemont FillersMelissa S O'Sullivan, NP while in the presence of Lemont FillersMelissa S O'Sullivan, NP.   I, Lemont FillersMelissa S O'Sullivan, NP, personally preformed the services described in this documentation.  All medical record entries made by the scribe were at my direction and in my presence.  I have reviewed the chart and discharge instructions (if applicable) and agree that the record reflects my personal performance and is accurate and complete. 06/11/22   Lemont FillersMelissa S O'Sullivan, NP

## 2022-06-09 ENCOUNTER — Ambulatory Visit: Payer: Commercial Managed Care - PPO | Admitting: Family

## 2022-06-09 VITALS — BP 104/73 | HR 72 | Temp 98.0°F | Resp 16 | Wt 170.0 lb

## 2022-06-09 DIAGNOSIS — E119 Type 2 diabetes mellitus without complications: Secondary | ICD-10-CM

## 2022-06-09 DIAGNOSIS — W460XXA Contact with hypodermic needle, initial encounter: Secondary | ICD-10-CM

## 2022-06-09 DIAGNOSIS — K219 Gastro-esophageal reflux disease without esophagitis: Secondary | ICD-10-CM | POA: Diagnosis not present

## 2022-06-09 DIAGNOSIS — K625 Hemorrhage of anus and rectum: Secondary | ICD-10-CM

## 2022-06-09 DIAGNOSIS — Z0000 Encounter for general adult medical examination without abnormal findings: Secondary | ICD-10-CM

## 2022-06-09 NOTE — Assessment & Plan Note (Signed)
Wt Readings from Last 3 Encounters:  06/09/22 170 lb (77.1 kg)  05/25/22 169 lb 12.8 oz (77 kg)  12/09/21 181 lb (82.1 kg)   Obtain follow up A1C.  Expect it to be very good given her weight loss. Continue mounjaro. OK to remain off of metformin.

## 2022-06-11 ENCOUNTER — Encounter: Payer: Self-pay | Admitting: Family

## 2022-06-11 NOTE — Addendum Note (Signed)
Addended by: Sandford Craze on: 06/11/2022 02:18 PM   Modules accepted: Orders

## 2022-06-11 NOTE — Assessment & Plan Note (Signed)
Resolved, GI consult is pending.

## 2022-06-11 NOTE — Assessment & Plan Note (Signed)
Suspect that mounjaro is contributing to hr symptoms. She continues omeprazole prn.  Offered to change her to daily prilosec but she declines as she wishes to try to minimize her medication use as much as possible.  She does have some dysphagia and probably could benefit from an endoscopy as well but will defer to GI.

## 2022-07-12 ENCOUNTER — Telehealth: Payer: Self-pay | Admitting: *Deleted

## 2022-07-12 NOTE — Telephone Encounter (Signed)
Sara Finley (FYI), Pt walked in to the office at 4:05 and wanted to complete future labs from April.  Lab had 3 patients waiting to be seen.  Front office explained to pt that an appointment is needed as we are not a "walk in" lab and 3:30 is our last scheduled lab appt.  Pt declined to schedule appt and said she would call back to schedule lab appointment.

## 2022-07-12 NOTE — Telephone Encounter (Signed)
Noted  

## 2022-07-14 ENCOUNTER — Other Ambulatory Visit: Payer: Commercial Managed Care - PPO

## 2022-07-14 ENCOUNTER — Other Ambulatory Visit (INDEPENDENT_AMBULATORY_CARE_PROVIDER_SITE_OTHER): Payer: Commercial Managed Care - PPO

## 2022-07-14 DIAGNOSIS — W460XXA Contact with hypodermic needle, initial encounter: Secondary | ICD-10-CM

## 2022-07-14 DIAGNOSIS — E119 Type 2 diabetes mellitus without complications: Secondary | ICD-10-CM

## 2022-07-14 LAB — MICROALBUMIN / CREATININE URINE RATIO
Creatinine,U: 67.7 mg/dL
Microalb Creat Ratio: 1 mg/g (ref 0.0–30.0)
Microalb, Ur: <0.7 mg/dL (ref 0.0–1.9)

## 2022-07-15 LAB — HIV ANTIBODY (ROUTINE TESTING W REFLEX): HIV 1&2 Ab, 4th Generation: NONREACTIVE

## 2022-07-15 LAB — HEPATITIS C ANTIBODY: Hepatitis C Ab: NONREACTIVE

## 2022-07-15 LAB — RPR: RPR Ser Ql: NONREACTIVE

## 2022-07-31 ENCOUNTER — Other Ambulatory Visit (HOSPITAL_BASED_OUTPATIENT_CLINIC_OR_DEPARTMENT_OTHER): Payer: Self-pay

## 2022-08-02 ENCOUNTER — Other Ambulatory Visit: Payer: Self-pay | Admitting: Family

## 2022-08-02 DIAGNOSIS — Z1231 Encounter for screening mammogram for malignant neoplasm of breast: Secondary | ICD-10-CM

## 2022-08-03 ENCOUNTER — Other Ambulatory Visit (HOSPITAL_BASED_OUTPATIENT_CLINIC_OR_DEPARTMENT_OTHER): Payer: Self-pay

## 2022-08-14 ENCOUNTER — Encounter: Payer: Commercial Managed Care - PPO | Admitting: Obstetrics and Gynecology

## 2022-08-17 ENCOUNTER — Other Ambulatory Visit (HOSPITAL_BASED_OUTPATIENT_CLINIC_OR_DEPARTMENT_OTHER): Payer: Self-pay

## 2022-08-17 ENCOUNTER — Ambulatory Visit: Payer: Commercial Managed Care - PPO | Admitting: Gastroenterology

## 2022-08-17 ENCOUNTER — Encounter: Payer: Self-pay | Admitting: Gastroenterology

## 2022-08-17 VITALS — BP 100/60 | HR 71 | Ht 63.0 in | Wt 168.4 lb

## 2022-08-17 DIAGNOSIS — R110 Nausea: Secondary | ICD-10-CM

## 2022-08-17 DIAGNOSIS — R1310 Dysphagia, unspecified: Secondary | ICD-10-CM

## 2022-08-17 DIAGNOSIS — R9389 Abnormal findings on diagnostic imaging of other specified body structures: Secondary | ICD-10-CM

## 2022-08-17 DIAGNOSIS — K921 Melena: Secondary | ICD-10-CM

## 2022-08-17 DIAGNOSIS — K219 Gastro-esophageal reflux disease without esophagitis: Secondary | ICD-10-CM

## 2022-08-17 MED ORDER — NA SULFATE-K SULFATE-MG SULF 17.5-3.13-1.6 GM/177ML PO SOLN
1.0000 | Freq: Once | ORAL | 0 refills | Status: AC
  Filled 2022-08-17: qty 354, 1d supply, fill #0

## 2022-08-17 NOTE — Patient Instructions (Addendum)
You have been scheduled for an endoscopy and colonoscopy. Please follow the written instructions given to you at your visit today. Please pick up your prep supplies at the pharmacy within the next 1-3 days. If you use inhalers (even only as needed), please bring them with you on the day of your procedure.   _______________________________________________________  If your blood pressure at your visit was 140/90 or greater, please contact your primary care physician to follow up on this.  _______________________________________________________  If you are age 25 or older, your body mass index should be between 23-30. Your Body mass index is 29.83 kg/m. If this is out of the aforementioned range listed, please consider follow up with your Primary Care Provider.  If you are age 50 or younger, your body mass index should be between 19-25. Your Body mass index is 29.83 kg/m. If this is out of the aformentioned range listed, please consider follow up with your Primary Care Provider.   __________________________________________________________  The [facility] providers would like to encourage you to use [facility] to communicate with providers for non-urgent requests or questions.  Due to long hold times on the telephone, sending your provider a message by [facility] may be a faster and more efficient way to get a response.  Please allow 48 business hours for a response.  Please remember that this is for non-urgent requests.   Thank you for choosing me and [facility].  Vito Cirigliano, D.O.

## 2022-08-17 NOTE — Progress Notes (Signed)
Chief Complaint: Hematochezia, GERD   Referring Provider:     Sandford Craze, NP   HPI:     Sara Finley is a 42 y.o. female with a history of diabetes referred to the Gastroenterology Clinic for evaluation of hematochezia and GERD.  Has had reflux and nausea for a number of years.  Had reflux symptoms during her pregnancy 9 years ago, with index symptoms of heartburn, regurgitation.  Has been taking omeprazole and Pepcid on demand, but sometimes not efficacious.  Symptoms have been slowly worsening over time.  Exacerbation with EtOH (wine) which she has since stopped drinking.  Can have nausea in the morning along with increased mucus production.  Nausea also started with pregnancy and has essentially persisted.  Very rare emesis.  No hematemesis, hematochezia, melena.  Interestingly, she tends to have nausea after BM.  This too has been ongoing for a long time.  Occurs even without straining.  Additionally, has intermittent dysphagia, pointing to her suprasternal notch.  Can typically alleviate with a chin tuck maneuver.  Does report having history of H. pylori 10+ years ago treated with Augmentin, Metronidazole, PPI with confirmation of eradication.   Separately, had an episode of large-volume BRBPR in 04/2022.  Had smaller volume BRBPR with subsequent BM, then decreasing volume over 1 week with subsequent resolution.  Has not had recurrence since then.  Did have some lower abdominal pain and rectal burning for that week as well, but that has since resolved.    Reports a history of chronic constipation mainly treated with dietary modification alone.  Started Mounjaro and constipation worsened.  -05/17/2022: CT A/P: Subcentimeter hepatic cysts vs hemangiomas, otherwise no duct dilation and normal GB, pancreas, spleen.  Normal-appearing stomach, small bowel loops.  Mild diffuse wall thickening in the rectosigmoid colon, otherwise normal-appearing colon without  obstruction. - 05/17/2022: Normal CBC, CMP, iron panel.  ESR 39  No previous EGD or colonoscopy  PGF with Colon Cancer. MGF with stomach issues and spontaneous perforation with subsequent ostomy.       Latest Ref Rng & Units 05/25/2022   11:39 AM 07/23/2019   12:01 PM 09/25/2018    9:28 AM  CBC  WBC 4.0 - 10.5 K/uL 4.6  6.0  8.1   Hemoglobin 12.0 - 15.0 g/dL 16.1  09.6  04.5   Hematocrit 36.0 - 46.0 % 38.1  38.1  40.8   Platelets 150.0 - 400.0 K/uL 299.0  292.0  293.0       Latest Ref Rng & Units 05/25/2022   11:39 AM 12/12/2021   12:31 PM 09/09/2021    7:38 AM  CMP  Glucose 70 - 99 mg/dL 84  75  409   BUN 6 - 23 mg/dL 10  8  15    Creatinine 0.40 - 1.20 mg/dL 8.11  9.14  7.82   Sodium 135 - 145 mEq/L 137  137  135   Potassium 3.5 - 5.1 mEq/L 4.2  4.1  3.9   Chloride 96 - 112 mEq/L 106  102  103   CO2 19 - 32 mEq/L 27  27  25    Calcium 8.4 - 10.5 mg/dL 8.9  9.1  9.1   Total Protein 6.0 - 8.3 g/dL 7.0  7.0    Total Bilirubin 0.2 - 1.2 mg/dL 0.5  0.3    Alkaline Phos 39 - 117 U/L 55  62    AST 0 - 37 U/L 15  14    ALT 0 - 35 U/L 8  7       Past Medical History:  Diagnosis Date   Abnormal Pap smear of cervix 2010   + HPV. normal pap since    GERD (gastroesophageal reflux disease)    History of Helicobacter pylori infection    Hyperglycemia    Prediabetes    Rectal bleeding    Stress incontinence    Thyroid nodule    Type 2 diabetes mellitus without complications (HCC) 01/09/2014   A1C 6.0 8/15     Past Surgical History:  Procedure Laterality Date   APPENDECTOMY     BREAST ENHANCEMENT SURGERY Bilateral    2010-saline   BREAST IMPLANT REMOVAL Bilateral    pt states summer of  2021, removed   COLPOSCOPY  ~2010   Normal   UMBILICAL HERNIA REPAIR  @ age 86   Family History  Problem Relation Age of Onset   Hypertension Mother    Hyperlipidemia Mother    Diabetes Mellitus II Mother    Hypertension Father    Hyperlipidemia Father    Diabetes Father         ?pre-diabetes   Benign prostatic hyperplasia Father    Sleep apnea Father    Asthma Father    Cataracts Father    Anemia Sister        hyperglycemia   Sickle cell trait Sister    Goiter Sister    Sleep apnea Brother    Hypertension Paternal Grandmother    Stroke Paternal Grandmother    Colon cancer Paternal Grandfather    Hypertension Paternal Uncle    Heart disease Paternal Uncle 36       MI/CABG x 4- died after failed bipass   Liver disease Paternal Uncle        pt said she thinks it was due to hep C   Esophageal cancer Neg Hx    Social History   Tobacco Use   Smoking status: Never   Smokeless tobacco: Never  Vaping Use   Vaping Use: Never used  Substance Use Topics   Alcohol use: Not Currently    Comment: seldomly   Drug use: No   Current Outpatient Medications  Medication Sig Dispense Refill   B Complex Vitamins (VITAMIN-B COMPLEX) TABS Take 1 tablet by mouth daily.     omeprazole (PRILOSEC) 20 MG capsule Take 20-40 mg by mouth as needed.     Cholecalciferol (VITAMIN D) 50 MCG (2000 UT) tablet Take 2,000 Units by mouth daily.     Multiple Vitamin (MULTIVITAMIN WITH MINERALS) TABS tablet Take 1 tablet by mouth daily.     tirzepatide [facility]) 5 MG/0.5ML Pen Inject 5 mg into the skin once a week. 6 mL 1   No current facility-administered medications for this visit.   Allergies  Allergen Reactions   Asa [Aspirin] Other (See Comments)    gastriitis   Belviq [Lorcaserin Hcl] Other (See Comments)    Dizziness, nausea     Review of Systems: All systems reviewed and negative except where noted in HPI.     Physical Exam:    Wt Readings from Last 3 Encounters:  08/17/22 168 lb 6 oz (76.4 kg)  06/09/22 170 lb (77.1 kg)  05/25/22 169 lb 12.8 oz (77 kg)    BP 100/60   Pulse 71   Ht 5\' 3"  (1.6 m)   Wt 168 lb 6 oz (76.4 kg)   BMI 29.83 kg/m  Constitutional:  Pleasant,  in no acute distress. Psychiatric: Normal mood and affect. Behavior is  normal. Abdominal: Soft, nondistended, nontender. Bowel sounds active throughout. There are no masses palpable. No hepatomegaly. Neurological: Alert and oriented to person place and time. Skin: Skin is warm and dry. No rashes noted.   ASSESSMENT AND PLAN;   1) GERD 2) Heartburn 3) Regurgitation - EGD to evaluate for erosive esophagitis, hiatal hernia, LES laxity - Discussed role/utility of trial high-dose PPI for diagnostic and therapeutic intent.  She prefers to hold off on new medication at this juncture, and instead proceed with upper endoscopy - Continue on-demand therapy in the meantime - Continue antireflux lifestyle/dietary modifications with avoidance of exacerbating type foods  4) Nausea Longstanding history of nausea - Evaluate for medical/luminal pathology with EGD as above  5) History of H. pylori - H. pylori 10+ years ago treated with triple therapy and confirmed eradication per patient - Evaluate for recurrence at time of EGD  6) Hematochezia 7) Abnormal imaging study Discussed DDx for presenting symptoms - Colonoscopy to evaluate further  8) Dysphagia - Esophageal dilation and biopsies as appropriate at time of upper endoscopy as above - Continue cutting food into small pieces, chew thoroughly, and plenty fluids with meals  9) Diabetes - Hold Mounjaro at least 1 week prior to procedures  10) Constipation - May need to change from [facility] 2 given exacerbation of chronic constipation after starting therapy - Continue hydration - Plan for colonoscopy as above to evaluate for mucosal/luminal pathology  She is planning to travel to [place] in the next couple of weeks.  Due to travel and her work schedule, procedures will be scheduled on 10/03/2018 4 in the afternoon   The indications, risks, and benefits of EGD and colonoscopy were explained to the patient in detail. Risks include but are not limited to bleeding, perforation, adverse reaction to medications, and  cardiopulmonary compromise. Sequelae include but are not limited to the possibility of surgery, hospitalization, and mortality. The patient verbalized understanding and wished to proceed. All questions answered, referred to scheduler and bowel prep ordered. Further recommendations pending results of the exam.      Shellia Cleverly, DO, FACG  08/17/2022, 3:44 PM   Sandford Craze, NP

## 2022-08-18 ENCOUNTER — Other Ambulatory Visit (HOSPITAL_BASED_OUTPATIENT_CLINIC_OR_DEPARTMENT_OTHER): Payer: Self-pay

## 2022-09-01 ENCOUNTER — Other Ambulatory Visit (HOSPITAL_BASED_OUTPATIENT_CLINIC_OR_DEPARTMENT_OTHER): Payer: Self-pay

## 2022-09-11 ENCOUNTER — Ambulatory Visit: Payer: Commercial Managed Care - PPO

## 2022-09-11 ENCOUNTER — Ambulatory Visit: Admission: RE | Admit: 2022-09-11 | Payer: Commercial Managed Care - PPO | Source: Ambulatory Visit

## 2022-09-11 DIAGNOSIS — Z1231 Encounter for screening mammogram for malignant neoplasm of breast: Secondary | ICD-10-CM | POA: Diagnosis not present

## 2022-09-13 ENCOUNTER — Other Ambulatory Visit (HOSPITAL_COMMUNITY): Payer: Self-pay

## 2022-09-19 ENCOUNTER — Ambulatory Visit (INDEPENDENT_AMBULATORY_CARE_PROVIDER_SITE_OTHER): Payer: Commercial Managed Care - PPO | Admitting: Obstetrics and Gynecology

## 2022-09-19 ENCOUNTER — Encounter: Payer: Self-pay | Admitting: Obstetrics and Gynecology

## 2022-09-19 ENCOUNTER — Encounter: Payer: Self-pay | Admitting: Gastroenterology

## 2022-09-19 VITALS — BP 91/54 | HR 69 | Ht 63.0 in | Wt 166.0 lb

## 2022-09-19 DIAGNOSIS — N3941 Urge incontinence: Secondary | ICD-10-CM

## 2022-09-19 DIAGNOSIS — Z01419 Encounter for gynecological examination (general) (routine) without abnormal findings: Secondary | ICD-10-CM

## 2022-09-19 NOTE — Progress Notes (Signed)
amamb  ANNUAL EXAM Patient name: Sara Finley MRN [id number]  Date of birth: 1980-11-24 Chief Complaint:   Gynecologic Exam  History of Present Illness:   Sara Finley is a 42 y.o. G31P1021  female being seen today for a routine annual exam.  Current complaints: just had mammogram that was normal, no vaginal or pelvic concerns. Reports some urge incontinence over the last couple of years. Will have urgency and typically make it to bathroom but when has to go again might have some leakage. Otherwise does not leak in between voiding.   Patient's last menstrual period was 08/30/2022 (exact date).   The pregnancy intention screening data noted above was reviewed. Potential methods of contraception were discussed. The patient elected to proceed with No data recorded.   Last pap 12/31/2020. Results were: NILM w/ HRHPV negative. H/O abnormal pap: no Last mammogram: 09/11/22. Results were: normal. Family h/o breast cancer: no Last colonoscopy: n/a. Results were: N/A. Family h/o colorectal cancer: no     05/09/2021    7:44 AM 07/23/2019    2:39 PM 01/10/2017    1:23 PM 11/24/2015    9:25 AM  Depression screen PHQ 2/9  Decreased Interest 0 0 1 0  Down, Depressed, Hopeless 0 0 1 0  PHQ - 2 Score 0 0 2 0  Altered sleeping  1 0   Tired, decreased energy  0 1   Change in appetite  0 1   Feeling bad or failure about yourself   0 1   Trouble concentrating  0 1   Moving slowly or fidgety/restless  1 0   Suicidal thoughts  0 0   PHQ-9 Score  2 6   Difficult doing work/chores  Not difficult at all           No data to display           Review of Systems:   Pertinent items are noted in HPI Denies any headaches, blurred vision, fatigue, shortness of breath, chest pain, abdominal pain, abnormal vaginal discharge/itching/odor/irritation, problems with periods, bowel movements, urination, or intercourse unless otherwise stated above. Pertinent History Reviewed:  Reviewed past  medical,surgical, social and family history.  Reviewed problem list, medications and allergies. Physical Assessment:   Vitals:   09/19/22 0823  Weight: 166 lb (75.3 kg)  Height: 5\' 3"  (1.6 m)  Body mass index is 29.41 kg/m.        Physical Examination:   General appearance - well appearing, and in no distress  Mental status - alert, oriented to person, place, and time  Psych:  She has a normal mood and affect  Skin - warm and dry, normal color, no suspicious lesions noted  Chest - effort normal, all lung fields clear to auscultation bilaterally  Heart - normal rate and regular rhythm  Neck:  midline trachea, no thyromegaly or nodules  Breasts - breasts appear normal, no suspicious masses, no skin or nipple changes or  axillary nodes  Abdomen - soft, nontender, nondistended, no masses or organomegaly  Pelvic - VULVA: normal appearing vulva with no masses, tenderness or lesions  VAGINA: normal appearing vagina with normal color and discharge, no lesions  CERVIX: normal appearing cervix without discharge or lesions, no CMT  Thin prep pap is not done  HR HPV cotesting  UTERUS: uterus is felt to be normal size, shape, consistency and nontender   ADNEXA: No adnexal masses or tenderness noted.  Extremities:  No swelling or varicosities noted  Chaperone present for  exam  No results found for this or any previous visit (from the past 24 hour(s)).  Assessment & Plan:  1. Well woman exam with routine gynecological exam Doing well, continues with pcp for lab work and mounjaro   2. Urge incontinence Has tried kegel in past, discussed nothing of concern on pelvic exam today,  trial of Pelvic floor pt   - Ambulatory referral to Physical Therapy   Labs/procedures today:   Mammogram: in 1 year, or sooner if problems Colonoscopy: @ 42yo, or sooner if problems  Follow-up: Return in about 1 year (around 09/19/2023) for Thayer Jew, FNP

## 2022-09-20 ENCOUNTER — Other Ambulatory Visit (HOSPITAL_BASED_OUTPATIENT_CLINIC_OR_DEPARTMENT_OTHER): Payer: Self-pay

## 2022-09-26 ENCOUNTER — Other Ambulatory Visit (HOSPITAL_BASED_OUTPATIENT_CLINIC_OR_DEPARTMENT_OTHER): Payer: Self-pay

## 2022-09-26 ENCOUNTER — Encounter: Payer: Self-pay | Admitting: Gastroenterology

## 2022-09-27 ENCOUNTER — Other Ambulatory Visit: Payer: Self-pay

## 2022-09-27 ENCOUNTER — Other Ambulatory Visit (HOSPITAL_BASED_OUTPATIENT_CLINIC_OR_DEPARTMENT_OTHER): Payer: Self-pay

## 2022-09-28 ENCOUNTER — Other Ambulatory Visit: Payer: Self-pay

## 2022-10-02 ENCOUNTER — Telehealth: Payer: Self-pay | Admitting: Gastroenterology

## 2022-10-02 NOTE — Telephone Encounter (Signed)
Inbound call from patient to reschedule 8/6 procedure. Patient is not scheduled for 11/1 and requesting updated prep instructions to be sent. Please advise, thank you.

## 2022-10-02 NOTE — Telephone Encounter (Signed)
Scheduled PreVisit appointment with a Nurse on 12/15/22 at 4 PM. Informed patient that she will get an instruction for the procedure on that day. Patient voiced understanding.

## 2022-10-03 ENCOUNTER — Encounter: Payer: Commercial Managed Care - PPO | Admitting: Gastroenterology

## 2022-12-01 ENCOUNTER — Ambulatory Visit: Payer: Commercial Managed Care - PPO | Admitting: Family

## 2022-12-01 VITALS — BP 100/71 | HR 65 | Temp 98.3°F | Resp 16 | Wt 165.0 lb

## 2022-12-01 DIAGNOSIS — Z23 Encounter for immunization: Secondary | ICD-10-CM | POA: Diagnosis not present

## 2022-12-01 DIAGNOSIS — J309 Allergic rhinitis, unspecified: Secondary | ICD-10-CM

## 2022-12-01 DIAGNOSIS — E119 Type 2 diabetes mellitus without complications: Secondary | ICD-10-CM

## 2022-12-01 DIAGNOSIS — E663 Overweight: Secondary | ICD-10-CM

## 2022-12-01 DIAGNOSIS — Z6829 Body mass index (BMI) 29.0-29.9, adult: Secondary | ICD-10-CM | POA: Diagnosis not present

## 2022-12-01 DIAGNOSIS — K219 Gastro-esophageal reflux disease without esophagitis: Secondary | ICD-10-CM

## 2022-12-01 DIAGNOSIS — E559 Vitamin D deficiency, unspecified: Secondary | ICD-10-CM

## 2022-12-01 DIAGNOSIS — K625 Hemorrhage of anus and rectum: Secondary | ICD-10-CM | POA: Diagnosis not present

## 2022-12-01 DIAGNOSIS — Z7985 Long-term (current) use of injectable non-insulin antidiabetic drugs: Secondary | ICD-10-CM | POA: Diagnosis not present

## 2022-12-01 NOTE — Assessment & Plan Note (Signed)
Resolved. Has upcoming colonoscopy.

## 2022-12-01 NOTE — Assessment & Plan Note (Signed)
Using flonase and zyrtec, but makes her drowsy.

## 2022-12-01 NOTE — Assessment & Plan Note (Signed)
Improved without omeprazole.

## 2022-12-01 NOTE — Progress Notes (Unsigned)
Subjective:     Patient ID: Sara Finley, female    DOB: September 05, 1980, 42 y.o.   MRN: [id number]  Chief Complaint  Patient presents with   Diabetes    Here for follow up    HPI  Discussed the use of AI scribe software for clinical note transcription with the patient, who gave verbal consent to proceed.  History of Present Illness   The patient, with a history of weight issues, gerda and DM2, presents for a routine follow-up. She reports struggling with weight loss despite being on a 5mg  dose of Mounjaro. She notes that while the medication initially curbed her appetite, she now feels she is losing its effect. She does feel that the Greggory Keen is helping her to maintain her weight. She also reports a struggle with maintaining a high-protein diet, which she believes is necessary for muscle gain and weight loss.  The patient also reports having reflux, which has improved since she reduced her coffee intake. She mentions having a colonoscopy and upper endoscopy scheduled due to previous concerns.  The patient also discusses her struggle with allergies and a recent cold. She is currently managing her allergies with Flonase and Zyrtec, but reports that Zyrtec makes her feel like a "zombie."      Lab Results  Component Value Date   HGBA1C 5.7 (H) 12/01/2022     Wt Readings from Last 3 Encounters:  12/01/22 165 lb (74.8 kg)  09/19/22 166 lb (75.3 kg)  08/17/22 168 lb 6 oz (76.4 kg)     Health Maintenance Due  Topic Date Due   COVID-19 Vaccine (5 - 2023-24 season) 10/29/2022    Past Medical History:  Diagnosis Date   Abnormal Pap smear of cervix 2010   + HPV. normal pap since    GERD (gastroesophageal reflux disease)    History of Helicobacter pylori infection    Hyperglycemia    Prediabetes    Rectal bleeding    Stress incontinence    Thyroid nodule    Type 2 diabetes mellitus without complications (HCC) 01/09/2014   A1C 6.0 8/15    Past Surgical History:  Procedure  Laterality Date   APPENDECTOMY     BREAST ENHANCEMENT SURGERY Bilateral    2010-saline   BREAST IMPLANT REMOVAL Bilateral    pt states summer of  2021, removed   COLPOSCOPY  ~2010   Normal   UMBILICAL HERNIA REPAIR  @ age 50    Family History  Problem Relation Age of Onset   Hypertension Mother    Hyperlipidemia Mother    Diabetes Mellitus II Mother    Hypertension Father    Hyperlipidemia Father    Diabetes Father        ?pre-diabetes   Benign prostatic hyperplasia Father    Sleep apnea Father    Asthma Father    Cataracts Father    Anemia Sister        hyperglycemia   Sickle cell trait Sister    Goiter Sister    Sleep apnea Brother    Hypertension Paternal Grandmother    Stroke Paternal Grandmother    Colon cancer Paternal Grandfather    Hypertension Paternal Uncle    Heart disease Paternal Uncle 83       MI/CABG x 4- died after failed bipass   Liver disease Paternal Uncle        pt said she thinks it was due to hep C   Esophageal cancer Neg Hx  Social History   Socioeconomic History   Marital status: Divorced    Spouse name: Not on file   Number of children: Not on file   Years of education: Not on file   Highest education level: Not on file  Occupational History   Not on file  Tobacco Use   Smoking status: Never   Smokeless tobacco: Never  Vaping Use   Vaping status: Never Used  Substance and Sexual Activity   Alcohol use: Not Currently    Comment: seldomly   Drug use: No   Sexual activity: Not Currently    Birth control/protection: Abstinence  Other Topics Concern   Not on file  Social History Narrative   1 daughter born 06/17/13   Married   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[employer]   Enjoys exercise, dancing, hiking, music, reading   Grew up in [place], moved here 19 for school, some family is here and some back home   Social Determinants of Corporate investment banker Strain: Not on file  Food Insecurity: Not on file   Transportation Needs: Not on file  Physical Activity: Not on file  Stress: Not on file  Social Connections: Not on file  Intimate Partner Violence: Not on file    Outpatient Medications Prior to Visit  Medication Sig Dispense Refill   B Complex Vitamins (VITAMIN-B COMPLEX) TABS Take 1 tablet by mouth daily.     Cholecalciferol (VITAMIN D) 50 MCG (2000 UT) tablet Take 2,000 Units by mouth daily.     Multiple Vitamin (MULTIVITAMIN WITH MINERALS) TABS tablet Take 1 tablet by mouth daily. (Patient not taking: Reported on 09/19/2022)     omeprazole (PRILOSEC) 20 MG capsule Take 20-40 mg by mouth as needed.     tirzepatide [facility]) 5 MG/0.5ML Pen Inject 5 mg into the skin once a week. 6 mL 1   No facility-administered medications prior to visit.    Allergies  Allergen Reactions   Asa [Aspirin] Other (See Comments)    gastriitis   Belviq [Lorcaserin Hcl] Other (See Comments)    Dizziness, nausea    ROS See HPI    Objective:    Physical Exam Constitutional:      General: She is not in acute distress.    Appearance: Normal appearance. She is well-developed.  HENT:     Head: Normocephalic and atraumatic.     Right Ear: External ear normal.     Left Ear: External ear normal.  Eyes:     General: No scleral icterus. Neck:     Thyroid: No thyromegaly.  Cardiovascular:     Rate and Rhythm: Normal rate and regular rhythm.     Heart sounds: Normal heart sounds. No murmur heard. Pulmonary:     Effort: Pulmonary effort is normal. No respiratory distress.     Breath sounds: Normal breath sounds. No wheezing.  Musculoskeletal:     Cervical back: Neck supple.  Skin:    General: Skin is warm and dry.  Neurological:     Mental Status: She is alert and oriented to person, place, and time.  Psychiatric:        Mood and Affect: Mood normal.        Behavior: Behavior normal.        Thought Content: Thought content normal.        Judgment: Judgment normal.    Diabetic Foot Exam -  Simple   Simple Foot Form Diabetic Foot exam was performed with  the following findings: Yes 12/01/2022  4:22 PM  Visual Inspection No deformities, no ulcerations, no other skin breakdown bilaterally: Yes Sensation Testing Intact to touch and monofilament testing bilaterally: Yes Pulse Check Posterior Tibialis and Dorsalis pulse intact bilaterally: Yes Comments      BP 100/71 (BP Location: Right Arm, Patient Position: Sitting, Cuff Size: Normal)   Pulse 65   Temp 98.3 F (36.8 C) (Oral)   Resp 16   Wt 165 lb (74.8 kg)   SpO2 100%   BMI 29.23 kg/m  Wt Readings from Last 3 Encounters:  12/01/22 165 lb (74.8 kg)  09/19/22 166 lb (75.3 kg)  08/17/22 168 lb 6 oz (76.4 kg)       Assessment & Plan:   Problem List Items Addressed This Visit       Unprioritized   Vitamin D deficiency    On 5000 international units every other day.       Type 2 diabetes mellitus without complications (HCC) - Primary     Last A1c was 5.6 six months ago. Patient reports some loss of efficacy with Mounjaro 5mg . Plan to check A1c today and consider increasing Mounjaro dose based on results. -Check A1c today.      Relevant Orders   Lipid panel (Completed)   HgB A1c (Completed)   Basic Metabolic Panel (BMET) (Completed)   Rectal bleeding    Resolved, except for one episode of blood streak on tissue during episode of constiatpion. . Has upcoming colonoscopy.       Overweight with body mass index (BMI) of 29 to 29.9 in adult    Weight is stable.  Continue regular exercise. Discussed strategies to increase protein intake in her diet.       GERD (gastroesophageal reflux disease)    Improved without omeprazole.        Allergic rhinitis    Using flonase and zyrtec, but makes her drowsy.       Other Visit Diagnoses     Needs flu shot       Relevant Orders   Flu vaccine trivalent PF, 6mos and older(Flulaval,Afluria,Fluarix,Fluzone) (Completed)      General Health  Maintenance -Administer influenza vaccine today. -Check lipid panel today. -Schedule follow-up in 6 months.  I am having Sara Finley maintain her multivitamin with minerals, Vitamin D, Mounjaro, omeprazole, and Vitamin-B Complex.  No orders of the defined types were placed in this encounter.

## 2022-12-01 NOTE — Assessment & Plan Note (Signed)
On 5000 international units every other day.

## 2022-12-02 ENCOUNTER — Other Ambulatory Visit: Payer: Self-pay | Admitting: Family

## 2022-12-02 LAB — BASIC METABOLIC PANEL
BUN: 11 mg/dL (ref 7–25)
CO2: 24 mmol/L (ref 20–32)
Calcium: 9.2 mg/dL (ref 8.6–10.2)
Chloride: 103 mmol/L (ref 98–110)
Creat: 0.81 mg/dL (ref 0.50–0.99)
Glucose, Bld: 81 mg/dL (ref 65–99)
Potassium: 4.1 mmol/L (ref 3.5–5.3)
Sodium: 137 mmol/L (ref 135–146)

## 2022-12-02 LAB — LIPID PANEL
Cholesterol: 154 mg/dL (ref <200)
HDL: 60 mg/dL (ref >=50)
LDL Cholesterol (Calc): 81 mg/dL
Non-HDL Cholesterol (Calc): 94 mg/dL (ref <130)
Total CHOL/HDL Ratio: 2.6 (calc) (ref <5.0)
Triglycerides: 54 mg/dL (ref <150)

## 2022-12-02 LAB — HEMOGLOBIN A1C
Hgb A1c MFr Bld: 5.7 %{Hb} — ABNORMAL HIGH (ref <5.7)
Mean Plasma Glucose: 117 mg/dL
eAG (mmol/L): 6.5 mmol/L

## 2022-12-03 MED ORDER — MOUNJARO 5 MG/0.5ML ~~LOC~~ SOAJ
5.0000 mg | SUBCUTANEOUS | 1 refills | Status: DC
  Filled 2022-12-03: qty 2, 28d supply, fill #0

## 2022-12-03 NOTE — Patient Instructions (Signed)
VISIT SUMMARY:  During your visit, we discussed your ongoing struggle with weight loss, reflux, and allergies. You mentioned that the medication Mounjaro, which was initially helping with your appetite, seems to be losing its effect. We also talked about your reflux symptoms, which have improved since you reduced your coffee intake. You also shared your concerns about your allergies and the side effects of Zyrtec. We discussed your upcoming colonoscopy and upper endoscopy, and your general health maintenance.  YOUR PLAN:  -DIABETES MELLITUS: Diabetes Mellitus is a condition that affects your body's ability to use sugar for energy. We will check your A1c levels today and may consider increasing your Mounjaro dose based on the results.  -WEIGHT MANAGEMENT: You've been having difficulty losing weight and gaining muscle mass. We discussed the importance of protein in your diet and suggested trying a protein powder to help with this.  -ALLERGIC RHINITIS: Allergic Rhinitis is a type of inflammation in the nose which occurs when the immune system overreacts to allergens in the air. We discussed trying Claritin instead of Zyrtec due to its less sedating effects.  -GASTROESOPHAGEAL REFLUX DISEASE (GERD): GERD is a chronic condition where stomach acid frequently flows back into the tube connecting your mouth and stomach (esophagus). Your symptoms have improved since reducing your coffee intake, so continue with your current management.  -GENERAL HEALTH MAINTENANCE: We will administer the influenza vaccine today and check your lipid panel. We will also schedule a follow-up appointment in 6 months.  INSTRUCTIONS:  Please continue with your current management for GERD and your planned colonoscopy and upper endoscopy. Try adding a protein powder to your diet for weight management and consider switching to Claritin for your allergies. We will check your A1c levels today and may adjust your Mounjaro dose based on  the results. We will also administer the influenza vaccine and check your lipid panel today. Please schedule a follow-up appointment in 6 months.

## 2022-12-03 NOTE — Assessment & Plan Note (Addendum)
  Last A1c was 5.6 six months ago. Patient reports some loss of efficacy with Mounjaro 5mg . Plan to check A1c today and consider increasing Mounjaro dose based on results. -Check A1c today.

## 2022-12-03 NOTE — Assessment & Plan Note (Signed)
Weight is stable.  Continue regular exercise. Discussed strategies to increase protein intake in her diet.

## 2022-12-04 ENCOUNTER — Other Ambulatory Visit (HOSPITAL_BASED_OUTPATIENT_CLINIC_OR_DEPARTMENT_OTHER): Payer: Self-pay

## 2022-12-10 ENCOUNTER — Encounter: Payer: Self-pay | Admitting: Family

## 2022-12-10 MED ORDER — MOUNJARO 5 MG/0.5ML ~~LOC~~ SOAJ
5.0000 mg | SUBCUTANEOUS | 1 refills | Status: DC
  Filled 2022-12-10: qty 6, 84d supply, fill #0
  Filled 2022-12-27 – 2022-12-29 (×2): qty 2, 28d supply, fill #0
  Filled 2023-02-01: qty 2, 28d supply, fill #1
  Filled 2023-03-14: qty 2, 28d supply, fill #2
  Filled 2023-04-21: qty 2, 28d supply, fill #3
  Filled 2023-05-30: qty 2, 28d supply, fill #4
  Filled 2023-07-13: qty 2, 28d supply, fill #5

## 2022-12-11 ENCOUNTER — Other Ambulatory Visit (HOSPITAL_BASED_OUTPATIENT_CLINIC_OR_DEPARTMENT_OTHER): Payer: Self-pay

## 2022-12-15 ENCOUNTER — Telehealth: Payer: Self-pay

## 2022-12-15 NOTE — Telephone Encounter (Signed)
Unable to reach patient for PV apt after 3 attempts. PV will need to be r/s by 5 PM to avoid cancellation of ENDO/COLON with Dr. Barron Alvine. VM left making the patient aware.

## 2022-12-20 ENCOUNTER — Ambulatory Visit (AMBULATORY_SURGERY_CENTER): Payer: Commercial Managed Care - PPO

## 2022-12-20 ENCOUNTER — Other Ambulatory Visit (HOSPITAL_BASED_OUTPATIENT_CLINIC_OR_DEPARTMENT_OTHER): Payer: Self-pay

## 2022-12-20 ENCOUNTER — Encounter: Payer: Self-pay | Admitting: Gastroenterology

## 2022-12-20 VITALS — Ht 63.0 in | Wt 160.0 lb

## 2022-12-20 DIAGNOSIS — K921 Melena: Secondary | ICD-10-CM

## 2022-12-20 MED ORDER — NA SULFATE-K SULFATE-MG SULF 17.5-3.13-1.6 GM/177ML PO SOLN
1.0000 | Freq: Once | ORAL | 0 refills | Status: AC
Start: 2022-12-20 — End: 2022-12-28
  Filled 2022-12-20: qty 354, 1d supply, fill #0

## 2022-12-20 NOTE — Progress Notes (Signed)
No egg or soy allergy known to patient  No issues known to pt with past sedation with any surgeries or procedures Patient denies ever being told they had issues or difficulty with intubation  No FH of Malignant Hyperthermia Pt is not on diet pills Pt is not on  home 02  Pt is not on blood thinners  Pt has issues with constipation (2 day prep) No A fib or A flutter Have any cardiac testing pending--No Pt can ambulate  Pt denies use of chewing tobacco Discussed diabetic I weight loss medication holds Discussed NSAID holds Checked BMI Pt instructed to use Singlecare.com or GoodRx for a price reduction on prep  Patient's chart reviewed by Cathlyn Parsons CNRA prior to previsit and patient appropriate for the [facility].  Pre visit completed and red dot placed by patient's name on their procedure day (on provider's schedule).

## 2022-12-25 ENCOUNTER — Encounter: Payer: Self-pay | Admitting: Gastroenterology

## 2022-12-27 ENCOUNTER — Other Ambulatory Visit (HOSPITAL_BASED_OUTPATIENT_CLINIC_OR_DEPARTMENT_OTHER): Payer: Self-pay

## 2022-12-29 ENCOUNTER — Other Ambulatory Visit: Payer: Self-pay

## 2022-12-29 ENCOUNTER — Other Ambulatory Visit (HOSPITAL_COMMUNITY): Payer: Self-pay

## 2022-12-29 ENCOUNTER — Encounter: Payer: Self-pay | Admitting: Gastroenterology

## 2022-12-29 ENCOUNTER — Ambulatory Visit: Payer: Commercial Managed Care - PPO | Admitting: Gastroenterology

## 2022-12-29 VITALS — BP 102/67 | HR 69 | Temp 98.0°F | Resp 15 | Ht 63.0 in | Wt 160.0 lb

## 2022-12-29 DIAGNOSIS — K640 First degree hemorrhoids: Secondary | ICD-10-CM | POA: Diagnosis not present

## 2022-12-29 DIAGNOSIS — R110 Nausea: Secondary | ICD-10-CM

## 2022-12-29 DIAGNOSIS — E119 Type 2 diabetes mellitus without complications: Secondary | ICD-10-CM | POA: Diagnosis not present

## 2022-12-29 DIAGNOSIS — K2970 Gastritis, unspecified, without bleeding: Secondary | ICD-10-CM | POA: Diagnosis not present

## 2022-12-29 DIAGNOSIS — R194 Change in bowel habit: Secondary | ICD-10-CM | POA: Diagnosis not present

## 2022-12-29 DIAGNOSIS — K219 Gastro-esophageal reflux disease without esophagitis: Secondary | ICD-10-CM

## 2022-12-29 DIAGNOSIS — R1310 Dysphagia, unspecified: Secondary | ICD-10-CM | POA: Diagnosis not present

## 2022-12-29 DIAGNOSIS — K921 Melena: Secondary | ICD-10-CM | POA: Diagnosis not present

## 2022-12-29 DIAGNOSIS — R933 Abnormal findings on diagnostic imaging of other parts of digestive tract: Secondary | ICD-10-CM | POA: Diagnosis not present

## 2022-12-29 DIAGNOSIS — R9389 Abnormal findings on diagnostic imaging of other specified body structures: Secondary | ICD-10-CM

## 2022-12-29 MED ORDER — DEXTROSE 5 % IV SOLN: INTRAVENOUS | Status: AC

## 2022-12-29 MED ORDER — DEXTROSE 50 % IV SOLN
25.0000 mL | Freq: Once | INTRAVENOUS | Status: AC
Start: 2022-12-29 — End: 2022-12-29
  Administered 2022-12-29: 25 mL via INTRAVENOUS

## 2022-12-29 MED ORDER — SODIUM CHLORIDE 0.9 % IV SOLN: 500.0000 mL | Freq: Once | INTRAVENOUS | Status: DC

## 2022-12-29 NOTE — Progress Notes (Signed)
Pt given 1/2 amp D50 preop and D5 for BS off 65 and 57.

## 2022-12-29 NOTE — Progress Notes (Signed)
GASTROENTEROLOGY PROCEDURE H&P NOTE   Primary Care Physician: Sandford Craze, NP    Reason for Procedure:   GERD, nausea, dyphagia, hematochezia, constipation, Fhx Colon cancer  Plan:    EGD, colonsocopy  Patient is appropriate for endoscopic procedure(s) in the ambulatory ([facility]) setting.  The nature of the procedure, as well as the risks, benefits, and alternatives were carefully and thoroughly reviewed with the patient. Ample time for discussion and questions allowed. The patient understood, was satisfied, and agreed to proceed.     HPI: Sara Finley is a 42 y.o. female who presents for EGD for evaluation of GERD, heartburn, nausea, dysphagia, hx of H pylori, and colonsocopy for evaluation of hematochezia, constipation, abnormal CT.   -05/17/2022: CT A/P: Subcentimeter hepatic cysts vs hemangiomas, otherwise no duct dilation and normal GB, pancreas, spleen.  Normal-appearing stomach, small bowel loops.  Mild diffuse wall thickening in the rectosigmoid colon, otherwise normal-appearing colon without obstruction. - 05/17/2022: Normal CBC, CMP, iron panel.  ESR 39   No previous EGD or colonoscopy  Past Medical History:  Diagnosis Date   Abnormal Pap smear of cervix 2010   + HPV. normal pap since    GERD (gastroesophageal reflux disease)    History of Helicobacter pylori infection    Hyperglycemia    Prediabetes    Rectal bleeding    Stress incontinence    Thyroid nodule    Type 2 diabetes mellitus without complications (HCC) 01/09/2014   A1C 6.0 8/15    Past Surgical History:  Procedure Laterality Date   APPENDECTOMY     BREAST ENHANCEMENT SURGERY Bilateral    2010-saline   BREAST IMPLANT REMOVAL Bilateral    pt states summer of  2021, removed   COLPOSCOPY  ~2010   Normal   UMBILICAL HERNIA REPAIR  @ age 11    Prior to Admission medications   Medication Sig Start Date End Date Taking? Authorizing Provider  B Complex Vitamins (VITAMIN-B COMPLEX) TABS Take  1 tablet by mouth daily.    [provider]  Cholecalciferol (VITAMIN D) 50 MCG (2000 UT) tablet Take 2,000 Units by mouth daily.    [provider]  Multiple Vitamin (MULTIVITAMIN WITH MINERALS) TABS tablet Take 1 tablet by mouth daily.    [provider]  omeprazole (PRILOSEC) 20 MG capsule Take 20-40 mg by mouth as needed.    [provider]  tirzepatide Greggory Keen) 5 MG/0.5ML Pen Inject 5 mg into the skin once a week. 12/10/22   Sandford Craze, NP    Current Outpatient Medications  Medication Sig Dispense Refill   B Complex Vitamins (VITAMIN-B COMPLEX) TABS Take 1 tablet by mouth daily.     Cholecalciferol (VITAMIN D) 50 MCG (2000 UT) tablet Take 2,000 Units by mouth daily.     Multiple Vitamin (MULTIVITAMIN WITH MINERALS) TABS tablet Take 1 tablet by mouth daily.     omeprazole (PRILOSEC) 20 MG capsule Take 20-40 mg by mouth as needed.     tirzepatide [facility]) 5 MG/0.5ML Pen Inject 5 mg into the skin once a week. 6 mL 1   No current facility-administered medications for this visit.    Allergies as of 12/29/2022 - Review Complete 12/29/2022  Allergen Reaction Noted   Asa [aspirin] Other (See Comments) 01/16/2014   Belviq [lorcaserin hcl] Other (See Comments) 11/17/2014    Family History  Problem Relation Age of Onset   Hypertension Mother    Hyperlipidemia Mother    Diabetes Mellitus II Mother    Hypertension  Father    Hyperlipidemia Father    Diabetes Father        ?pre-diabetes   Benign prostatic hyperplasia Father    Sleep apnea Father    Asthma Father    Cataracts Father    Anemia Sister        hyperglycemia   Sickle cell trait Sister    Goiter Sister    Sleep apnea Brother    Hypertension Paternal Uncle    Heart disease Paternal Uncle 72       MI/CABG x 4- died after failed bipass   Liver disease Paternal Uncle        pt said she thinks it was due to hep C   Hypertension Paternal Grandmother    Stroke Paternal  Grandmother    Colon cancer Paternal Grandfather    Esophageal cancer Neg Hx    Rectal cancer Neg Hx    Stomach cancer Neg Hx     Social History   Socioeconomic History   Marital status: Divorced    Spouse name: Not on file   Number of children: Not on file   Years of education: Not on file   Highest education level: Not on file  Occupational History   Not on file  Tobacco Use   Smoking status: Never   Smokeless tobacco: Never  Vaping Use   Vaping status: Never Used  Substance and Sexual Activity   Alcohol use: Yes    Comment: seldomly   Drug use: No   Sexual activity: Not Currently    Birth control/protection: Abstinence  Other Topics Concern   Not on file  Social History Narrative   1 daughter born 06/17/13   Married   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[employer]   Enjoys exercise, dancing, hiking, music, reading   Grew up in [place], moved here 19 for school, some family is here and some back home   Social Determinants of Corporate investment banker Strain: Not on file  Food Insecurity: Not on file  Transportation Needs: Not on file  Physical Activity: Not on file  Stress: Not on file  Social Connections: Not on file  Intimate Partner Violence: Not on file    Physical Exam: Vital signs in last 24 hours: @LMP  11/20/2022 Comment: Not using any birth control GEN: NAD EYE: Sclerae anicteric ENT: MMM CV: Non-tachycardic Pulm: CTA b/l GI: Soft, NT/ND NEURO:  Alert & Oriented x 3   Doristine Locks, DO [facility]   12/29/2022 9:48 AM

## 2022-12-29 NOTE — Op Note (Signed)
[facility] Patient Name: Sara Finley Procedure Date: 12/29/2022 9:50 AM MRN: [id number] Endoscopist: Doristine Locks , MD, [id number] Age: 42 Referring MD:  Date of Birth: April 22, 1980 Gender: Female Account #: [id number] Procedure:                Upper GI endoscopy Indications:              Dysphagia, Suspected esophageal reflux, Nausea, Hx                            of H pylori Medicines:                Monitored Anesthesia Care Procedure:                Pre-Anesthesia Assessment:                           - Prior to the procedure, a History and Physical                            was performed, and patient medications and                            allergies were reviewed. The patient's tolerance of                            previous anesthesia was also reviewed. The risks                            and benefits of the procedure and the sedation                            options and risks were discussed with the patient.                            All questions were answered, and informed consent                            was obtained. Prior Anticoagulants: The patient has                            taken no anticoagulant or antiplatelet agents. ASA                            Grade Assessment: III - A patient with severe                            systemic disease. After reviewing the risks and                            benefits, the patient was deemed in satisfactory                            condition to undergo the procedure.  After obtaining informed consent, the endoscope was                            passed under direct vision. Throughout the                            procedure, the patient's blood pressure, pulse, and                            oxygen saturations were monitored continuously. The                            Olympus Scope [id number] was introduced through the                            mouth, and advanced to the third  part of duodenum.                            The upper GI endoscopy was accomplished without                            difficulty. The patient tolerated the procedure                            well. Scope In: Scope Out: Findings:                 The examined esophagus was normal. A guidewire was                            placed and the scope was withdrawn. Dilation was                            performed with a Savary dilator with mild                            resistance at 17 mm. The dilation site was examined                            following endoscope reinsertion and showed no                            bleeding, mucosal tear or perforation. Estimated                            blood loss: none.                           The Z-line was regular and was found 38 cm from the                            incisors.                           Localized areas of mild inflammation characterized  by erythema was found in the gastric body. Biopsies                            were taken with a cold forceps for histology.                            Estimated blood loss was minimal.                           Normal mucosa was found in the gastric fundus, at                            the incisura, in the gastric antrum and at the                            pylorus.                           The examined duodenum was normal. Biopsies were                            taken with a cold forceps for histology. Estimated                            blood loss was minimal. Complications:            No immediate complications. Estimated Blood Loss:     Estimated blood loss was minimal. Impression:               - Normal esophagus. Dilated with 17 mm Savary                            dilator.                           - Z-line regular, 38 cm from the incisors.                           - A few scattered areas of mild, non-ulcer                            gastritis in the  gastric body. Biopsied.                           - Normal mucosa was found in the gastric fundus, in                            the incisura, in the antrum and in the pylorus.                           - Normal examined duodenum. Biopsied. Recommendation:           - Patient has a contact number available for  emergencies. The signs and symptoms of potential                            delayed complications were discussed with the                            patient. Return to normal activities tomorrow.                            Written discharge instructions were provided to the                            patient.                           - Resume previous diet.                           - Continue present medications.                           - Await pathology results. Doristine Locks, MD 12/29/2022 11:10:05 AM

## 2022-12-29 NOTE — Patient Instructions (Addendum)
Resume all of your previous medications a ordered.  Read the handouts given to you by your recovery room nurse.  Try to increase the fiber in your diet, and drink plenty of water.  Try metamucil, benefiber or citrucel to prevent constipation.  Repeat your colonoscopy in 10 years.  Resume your previous diet.  Be sure to take your omeprazole 1/2 hour before breakfast.  If your take it on a full stomach, it won't work.  YOU HAD AN ENDOSCOPIC PROCEDURE TODAY AT THE [facility]:   Refer to the procedure report that was given to you for any specific questions about what was found during the examination.  If the procedure report does not answer your questions, please call your gastroenterologist to clarify.  If you requested that your care partner not be given the details of your procedure findings, then the procedure report has been included in a sealed envelope for you to review at your convenience later.  YOU SHOULD EXPECT: Some feelings of bloating in the abdomen. Passage of more gas than usual.  Walking can help get rid of the air that was put into your GI tract during the procedure and reduce the bloating. If you had a lower endoscopy (such as a colonoscopy or flexible sigmoidoscopy) you may notice spotting of blood in your stool or on the toilet paper. If you underwent a bowel prep for your procedure, you may not have a normal bowel movement for a few days.  Please Note:  You might notice some irritation and congestion in your nose or some drainage.  This is from the oxygen used during your procedure.  There is no need for concern and it should clear up in a day or so.  SYMPTOMS TO REPORT IMMEDIATELY:  Following lower endoscopy (colonoscopy or flexible sigmoidoscopy):  Excessive amounts of blood in the stool  Significant tenderness or worsening of abdominal pains  Swelling of the abdomen that is new, acute  Fever of 100F or higher  Following upper endoscopy (EGD)  Vomiting of blood or  coffee ground material  New chest pain or pain under the shoulder blades  Painful or persistently difficult swallowing  New shortness of breath  Fever of 100F or higher  Black, tarry-looking stools  For urgent or emergent issues, a gastroenterologist can be reached at any hour by calling [phone]. Do not use MyChart messaging for urgent concerns.    DIET:  We do recommend a small meal at first, but then you may proceed to your regular diet.  Drink plenty of fluids but you should avoid alcoholic beverages for 24 hours.  ACTIVITY:  You should plan to take it easy for the rest of today and you should NOT DRIVE or use heavy machinery until tomorrow (because of the sedation medicines used during the test).    FOLLOW UP: Our staff will call the number listed on your records the next business day following your procedure.  We will call around 7:15- 8:00 am to check on you and address any questions or concerns that you may have regarding the information given to you following your procedure. If we do not reach you, we will leave a message.     If any biopsies were taken you will be contacted by phone or by letter within the next 1-3 weeks.  Please call us at [phone] if you have not heard about the biopsies in 3 weeks.    SIGNATURES/CONFIDENTIALITY: You and/or your care partner have signed paperwork  which will be entered into your electronic medical record.  These signatures attest to the fact that that the information above on your After Visit Summary has been reviewed and is understood.  Full responsibility of the confidentiality of this discharge information lies with you and/or your care-partner.

## 2022-12-29 NOTE — Op Note (Signed)
[facility] Patient Name: Sara Finley Procedure Date: 12/29/2022 9:50 AM MRN: [id number] Endoscopist: Doristine Locks , MD, [id number] Age: 42 Referring MD:  Date of Birth: 12/12/1980 Gender: Female Account #: [id number] Procedure:                Colonoscopy Indications:              Hematochezia, Abnormal CT of the GI tract, Change                            in bowel habits, Constipation Medicines:                Monitored Anesthesia Care Procedure:                Pre-Anesthesia Assessment:                           - Prior to the procedure, a History and Physical                            was performed, and patient medications and                            allergies were reviewed. The patient's tolerance of                            previous anesthesia was also reviewed. The risks                            and benefits of the procedure and the sedation                            options and risks were discussed with the patient.                            All questions were answered, and informed consent                            was obtained. Prior Anticoagulants: The patient has                            taken no anticoagulant or antiplatelet agents. ASA                            Grade Assessment: III - A patient with severe                            systemic disease. After reviewing the risks and                            benefits, the patient was deemed in satisfactory                            condition to undergo the procedure.  After obtaining informed consent, the colonoscope                            was passed under direct vision. Throughout the                            procedure, the patient's blood pressure, pulse, and                            oxygen saturations were monitored continuously. The                            Olympus Scope SN [id number] was introduced through the                            anus and advanced to  the the terminal ileum. The                            colonoscopy was performed without difficulty. The                            patient tolerated the procedure well. The quality                            of the bowel preparation was good. The terminal                            ileum, ileocecal valve, appendiceal orifice, and                            rectum were photographed. Scope In: 10:49:55 AM Scope Out: 11:01:10 AM Scope Withdrawal Time: 0 hours 9 minutes 1 second  Total Procedure Duration: 0 hours 11 minutes 15 seconds  Findings:                 The perianal and digital rectal examinations were                            normal.                           The entire colon appeared normal.                           Non-bleeding internal hemorrhoids were found during                            retroflexion. The hemorrhoids were small.                           The terminal ileum appeared normal. Complications:            No immediate complications. Estimated Blood Loss:     Estimated blood loss: none. Impression:               - The entire examined colon is normal.                           -  Non-bleeding internal hemorrhoids.                           - The examined portion of the ileum was normal.                           - No specimens collected. Recommendation:           - Patient has a contact number available for                            emergencies. The signs and symptoms of potential                            delayed complications were discussed with the                            patient. Return to normal activities tomorrow.                            Written discharge instructions were provided to the                            patient.                           - Resume previous diet.                           - Continue present medications.                           - Repeat colonoscopy in 10 years for screening                            purposes.                            - Use fiber, for example Citrucel, Fibercon, Konsyl                            or Metamucil.                           - Return to GI clinic PRN. Doristine Locks, MD 12/29/2022 11:13:23 AM

## 2022-12-29 NOTE — Progress Notes (Signed)
Vss nad trans to pacu 

## 2023-01-01 ENCOUNTER — Telehealth: Payer: Self-pay | Admitting: *Deleted

## 2023-01-01 NOTE — Telephone Encounter (Signed)
  Follow up Call-     12/29/2022    9:59 AM  Call back number  Post procedure Call Back phone  # [phone]  Permission to leave phone message Yes     Patient questions:  Message left to call if necessary.

## 2023-01-02 ENCOUNTER — Encounter: Payer: Self-pay | Admitting: Gastroenterology

## 2023-01-02 LAB — SURGICAL PATHOLOGY

## 2023-02-01 ENCOUNTER — Other Ambulatory Visit (HOSPITAL_COMMUNITY): Payer: Self-pay

## 2023-02-13 ENCOUNTER — Ambulatory Visit: Payer: Commercial Managed Care - PPO | Admitting: Family

## 2023-02-19 ENCOUNTER — Ambulatory Visit (INDEPENDENT_AMBULATORY_CARE_PROVIDER_SITE_OTHER): Payer: Commercial Managed Care - PPO | Admitting: Family

## 2023-02-19 ENCOUNTER — Encounter: Payer: Self-pay | Admitting: Family

## 2023-02-19 VITALS — BP 106/69 | HR 65 | Temp 98.5°F | Ht 63.0 in | Wt 163.0 lb

## 2023-02-19 DIAGNOSIS — Z0000 Encounter for general adult medical examination without abnormal findings: Secondary | ICD-10-CM | POA: Diagnosis not present

## 2023-02-19 NOTE — Assessment & Plan Note (Signed)
Continue healthyy diet, exercise.  Pap/mammo up to date.  Encouraged pt to get covid booster at the pharmacy.

## 2023-02-19 NOTE — Progress Notes (Signed)
Subjective:     Patient ID: Sara Finley, female    DOB: 1981-02-26, 42 y.o.   MRN: [id number]  Chief Complaint  Patient presents with   Annual Exam    HPI  Discussed the use of AI scribe software for clinical note transcription with the patient, who gave verbal consent to proceed.  History of Present Illness          Sara Finley presents for an annual physical. She reports no current cough, cold symptoms, skin concerns, or digestive issues. She mentions that she has been maintaining a healthy diet and exercise routine, working out at least three times a week. She has been stuck at a weight of 160-165 lbs, but does not express a desire to lose more weight. She has had a colonoscopy with no significant findings, but reports having hemorrhoids that were likely causing previous bleeding. She has also had a mammogram in July and is up to date with vision and dental check-ups.  She mentions a urinary urgency issue, which is exacerbated by coffee consumption. She has switched to decaf coffee, which has helped, but if she drinks more than 8 ounces, the urgency becomes problematic. She has discussed this with a previous provider and was told that pelvic floor rehab may help, but she has not yet pursued this. She does not report any frequent headaches or current concerns about depression or anxiety.  Immunizations: up to date except covid Diet: healthy Wt Readings from Last 3 Encounters:  02/19/23 163 lb (73.9 kg)  12/29/22 160 lb (72.6 kg)  12/20/22 160 lb (72.6 kg)  Exercise: 3-4 times a week.  Colonoscopy- up to date Pap Smear:  up to date Mammogram:7/24 Vision: up to date Dental: up tod ate    Health Maintenance Due  Topic Date Due   COVID-19 Vaccine (5 - 2024-25 season) 10/29/2022    Past Medical History:  Diagnosis Date   Abnormal Pap smear of cervix 2010   + HPV. normal pap since    GERD (gastroesophageal reflux disease)    History of Helicobacter pylori infection     Hyperglycemia    Prediabetes    Rectal bleeding    Stress incontinence    Thyroid nodule    Type 2 diabetes mellitus without complications (HCC) 01/09/2014   A1C 6.0 8/15    Past Surgical History:  Procedure Laterality Date   APPENDECTOMY     BREAST ENHANCEMENT SURGERY Bilateral    2010-saline   BREAST IMPLANT REMOVAL Bilateral    pt states summer of  2021, removed   COLPOSCOPY  ~2010   Normal   UMBILICAL HERNIA REPAIR  @ age 69    Family History  Problem Relation Age of Onset   Hypertension Mother    Hyperlipidemia Mother    Diabetes Mellitus II Mother    Hypertension Father    Hyperlipidemia Father    Diabetes Father        ?pre-diabetes   Benign prostatic hyperplasia Father    Sleep apnea Father    Asthma Father    Cataracts Father    Anemia Sister        hyperglycemia   Sickle cell trait Sister    Goiter Sister    Sleep apnea Brother    Hypertension Paternal Uncle    Heart disease Paternal Uncle 52       MI/CABG x 4- died after failed bipass   Liver disease Paternal Uncle        pt said she  thinks it was due to hep C   Hypertension Paternal Grandmother    Stroke Paternal Grandmother    Colon cancer Paternal Grandfather    Esophageal cancer Neg Hx    Rectal cancer Neg Hx    Stomach cancer Neg Hx     Social History   Socioeconomic History   Marital status: Divorced    Spouse name: Not on file   Number of children: Not on file   Years of education: Not on file   Highest education level: Not on file  Occupational History   Not on file  Tobacco Use   Smoking status: Never   Smokeless tobacco: Never  Vaping Use   Vaping status: Never Used  Substance and Sexual Activity   Alcohol use: Yes    Comment: seldomly   Drug use: No   Sexual activity: Not Currently    Birth control/protection: Abstinence  Other Topics Concern   Not on file  Social History Narrative   1 daughter born 06/17/13   Married   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[employer]    Enjoys exercise, dancing, hiking, music, reading   Grew up in [place], moved here 19 for school, some family is here and some back home   Social Drivers of Corporate investment banker Strain: Not on file  Food Insecurity: Not on file  Transportation Needs: Not on file  Physical Activity: Not on file  Stress: Not on file  Social Connections: Not on file  Intimate Partner Violence: Not on file    Outpatient Medications Prior to Visit  Medication Sig Dispense Refill   B Complex Vitamins (VITAMIN-B COMPLEX) TABS Take 1 tablet by mouth daily.     Cholecalciferol (VITAMIN D) 50 MCG (2000 UT) tablet Take 2,000 Units by mouth daily.     Multiple Vitamin (MULTIVITAMIN WITH MINERALS) TABS tablet Take 1 tablet by mouth daily.     omeprazole (PRILOSEC) 20 MG capsule Take 20-40 mg by mouth as needed.     tirzepatide [facility]) 5 MG/0.5ML Pen Inject 5 mg into the skin once a week. 6 mL 1   No facility-administered medications prior to visit.    Allergies  Allergen Reactions   Asa [Aspirin] Other (See Comments)    gastriitis   Belviq [Lorcaserin Hcl] Other (See Comments)    Dizziness, nausea    Review of Systems  Constitutional:  Negative for weight loss.  HENT:  Negative for congestion and hearing loss.   Eyes:  Negative for blurred vision.  Respiratory:  Negative for cough.   Cardiovascular:  Negative for leg swelling.  Gastrointestinal:  Negative for constipation and diarrhea.  Genitourinary:  Negative for dysuria and frequency.  Musculoskeletal:  Negative for joint pain and myalgias.  Skin:  Negative for rash.  Neurological:  Negative for headaches.  Psychiatric/Behavioral:         Denies depression/anxiety       Objective:    Physical Exam   BP 106/69 (BP Location: Right Arm, Patient Position: Sitting, Cuff Size: Small)   Pulse 65   Temp 98.5 F (36.9 C) (Oral)   Ht 5\' 3"  (1.6 m)   Wt 163 lb (73.9 kg)   BMI 28.87 kg/m  Wt Readings from Last 3 Encounters:   02/19/23 163 lb (73.9 kg)  12/29/22 160 lb (72.6 kg)  12/20/22 160 lb (72.6 kg)  Physical Exam  Constitutional: She is oriented to person, place, and time. She appears well-developed and well-nourished. No  distress.  HENT:  Head: Normocephalic and atraumatic.  Right Ear: Tympanic membrane and ear canal normal.  Left Ear: Tympanic membrane and ear canal normal.  Mouth/Throat: Oropharynx is clear and moist.  Eyes: Pupils are equal, round, and reactive to light. No scleral icterus.  Neck: Normal range of motion. No thyromegaly present.  Cardiovascular: Normal rate and regular rhythm.   No murmur heard. Pulmonary/Chest: Effort normal and breath sounds normal. No respiratory distress. He has no wheezes. She has no rales. She exhibits no tenderness.  Abdominal: Soft. Bowel sounds are normal. She exhibits no distension and no mass. There is no tenderness. There is no rebound and no guarding.  Musculoskeletal: She exhibits no edema.  Lymphadenopathy:    She has no cervical adenopathy.  Neurological: She is alert and oriented to person, place, and time. She has normal patellar reflexes. She exhibits normal muscle tone. Coordination normal.  Skin: Skin is warm and dry.  Psychiatric: She has a normal mood and affect. Her behavior is normal. Judgment and thought content normal.  Breast/pelvic: deferred       Assessment & Plan:        Assessment & Plan:   Problem List Items Addressed This Visit       Unprioritized   Preventative health care - Primary   Continue healthyy diet, exercise.  Pap/mammo up to date.  Encouraged pt to get covid booster at the pharmacy.        I am having Sara Finley maintain her multivitamin with minerals, Vitamin D, omeprazole, Vitamin-B Complex, and Mounjaro.  No orders of the defined types were placed in this encounter.

## 2023-02-19 NOTE — Patient Instructions (Signed)
VISIT SUMMARY:  Sara Finley, you came in today for your annual physical. You reported no major health concerns and mentioned that you have been maintaining a healthy diet and exercise routine. We discussed your urinary urgency issue, which improves with decaf coffee, and your stable weight. Your recent colonoscopy and mammogram results were normal, and you are up to date with your vision and dental check-ups.  YOUR PLAN:  -URINARY URGENCY: Urinary urgency is a sudden, strong need to urinate. Your urgency improves with decaf coffee, and we discussed the potential benefits of pelvic floor rehab and medication options. You prefer to manage this with dietary modifications, so continue with your current strategy.  -Diabetes -Your A1c was 5.7 two months ago, and your weight is stable. We will repeat your A1c test in two months to monitor your blood sugar levels.  -GENERAL HEALTH MAINTENANCE: Your tetanus and flu vaccines are up to date, and your colonoscopy results were normal. Your next Pap smear is due in 2027, and you had a mammogram in July 2024. Continue taking your current medications, ensure you get adequate rest, and maintain your exercise routine.   INSTRUCTIONS:  Repeat A1c test in two months to monitor blood sugar levels.

## 2023-03-15 ENCOUNTER — Other Ambulatory Visit: Payer: Self-pay

## 2023-04-21 ENCOUNTER — Other Ambulatory Visit (HOSPITAL_COMMUNITY): Payer: Self-pay

## 2023-05-02 LAB — HM DIABETES EYE EXAM

## 2023-05-31 ENCOUNTER — Other Ambulatory Visit: Payer: Self-pay

## 2023-06-01 LAB — CBC: RBC: 4.5 (ref 3.87–5.11)

## 2023-06-01 LAB — HEPATIC FUNCTION PANEL
ALT: 11 U/L (ref 7–35)
AST: 22 (ref 13–35)
Alkaline Phosphatase: 73 (ref 25–125)
Bilirubin, Total: 0.2

## 2023-06-01 LAB — CBC AND DIFFERENTIAL
HCT: 40 (ref 36–46)
Neutrophils Absolute: 38
Platelets: 275 10*3/uL (ref 150–400)
WBC: 5.1

## 2023-06-01 LAB — COMPREHENSIVE METABOLIC PANEL WITH GFR
Calcium: 9.4 (ref 8.7–10.7)
Globulin: 2.9
eGFR: 84

## 2023-06-01 LAB — LIPID PANEL
HDL: 65 (ref 35–70)
LDL Cholesterol: 100

## 2023-06-01 LAB — BASIC METABOLIC PANEL WITH GFR
BUN: 12 (ref 4–21)
CO2: 26 — AB (ref 13–22)
Creatinine: 0.9 (ref 0.5–1.1)
Glucose: 72
Potassium: 4.5 meq/L (ref 3.5–5.1)
Sodium: 140 (ref 137–147)

## 2023-06-01 LAB — HEMOGLOBIN A1C: Hemoglobin A1C: 5.6

## 2023-06-08 ENCOUNTER — Ambulatory Visit: Payer: Commercial Managed Care - PPO | Admitting: Family

## 2023-06-08 VITALS — BP 94/56 | HR 75 | Temp 97.5°F | Resp 16 | Ht 63.0 in | Wt 162.0 lb

## 2023-06-08 DIAGNOSIS — Z7985 Long-term (current) use of injectable non-insulin antidiabetic drugs: Secondary | ICD-10-CM

## 2023-06-08 DIAGNOSIS — K219 Gastro-esophageal reflux disease without esophagitis: Secondary | ICD-10-CM | POA: Diagnosis not present

## 2023-06-08 DIAGNOSIS — R232 Flushing: Secondary | ICD-10-CM | POA: Diagnosis not present

## 2023-06-08 DIAGNOSIS — E119 Type 2 diabetes mellitus without complications: Secondary | ICD-10-CM

## 2023-06-08 NOTE — Progress Notes (Signed)
 Subjective:     Patient ID: Sara Finley, female    DOB: Nov 12, 1980, 43 y.o.   MRN: [id number]  Chief Complaint  Patient presents with   Diabetes    Here for follow up    HPI  Discussed the use of AI scribe software for clinical note transcription with the patient, who gave verbal consent to proceed.  History of Present Illness  Sara Finley is a 43 year old female who presents for follow-up of diabetes management and hot flashes.  Her diabetes management is currently stable with an A1c of 5.7, improved from 5.6. She takes Mounjaro 5 mg every other week due to side effects such as decreased appetite and lightheadedness when taken weekly, which led to low blood sugar levels. She manages these symptoms by spacing out her doses. Her LDL is 100, and she was not fasting for her recent labs. Thyroid function, blood count, renal function, and calcium levels are normal. Previously, her calcium was slightly low, likely due to poor dietary intake, but she is doing better now.  She experiences hot flashes, particularly when feeling unwell. She recalls having hot flashes after returning from [place] a year and a half ago when she was sick, and recently during a period of upper respiratory symptoms. She is monitoring these symptoms to determine if they are related to illness rather than hormonal changes. She also reports night sweats during this period.  Her reflux symptoms have resolved, and she only takes omeprazole if she experiences nausea after taking Mounjaro weekly. She attributes her previous reflux to coffee consumption and has since switched to decaf, which has alleviated her symptoms. An endoscopy showed some inflammation but nothing significant.  She is concerned about muscle mass loss and is adjusting her diet to include more animal protein to maintain muscle mass. She has reduced her cardio exercise and is focusing on weight training.  Outside lab work is reviewed.    Health  Maintenance Due  Topic Date Due   Pneumococcal Vaccine 74-53 Years old (1 of 2 - PCV) Never done   COVID-19 Vaccine (5 - 2024-25 season) 10/29/2022    Past Medical History:  Diagnosis Date   Abnormal Pap smear of cervix 2010   + HPV. normal pap since    GERD (gastroesophageal reflux disease)    History of Helicobacter pylori infection    Hyperglycemia    Prediabetes    Rectal bleeding    Stress incontinence    Thyroid nodule    Type 2 diabetes mellitus without complications (HCC) 01/09/2014   A1C 6.0 8/15    Past Surgical History:  Procedure Laterality Date   APPENDECTOMY     BREAST ENHANCEMENT SURGERY Bilateral    2010-saline   BREAST IMPLANT REMOVAL Bilateral    pt states summer of  2021, removed   COLPOSCOPY  ~2010   Normal   UMBILICAL HERNIA REPAIR  @ age 40    Family History  Problem Relation Age of Onset   Hypertension Mother    Hyperlipidemia Mother    Diabetes Mellitus II Mother    Hypertension Father    Hyperlipidemia Father    Diabetes Father        ?pre-diabetes   Benign prostatic hyperplasia Father    Sleep apnea Father    Asthma Father    Cataracts Father    Anemia Sister        hyperglycemia   Sickle cell trait Sister    Goiter Sister    Sleep  apnea Brother    Hypertension Paternal Uncle    Heart disease Paternal Uncle 20       MI/CABG x 4- died after failed bipass   Liver disease Paternal Uncle        pt said she thinks it was due to hep C   Hypertension Paternal Grandmother    Stroke Paternal Grandmother    Colon cancer Paternal Grandfather    Esophageal cancer Neg Hx    Rectal cancer Neg Hx    Stomach cancer Neg Hx     Social History   Socioeconomic History   Marital status: Divorced    Spouse name: Not on file   Number of children: Not on file   Years of education: Not on file   Highest education level: Master's degree (e.g., MA, MS, MEng, MEd, MSW, MBA)  Occupational History   Not on file  Tobacco Use   Smoking status:  Never   Smokeless tobacco: Never  Vaping Use   Vaping status: Never Used  Substance and Sexual Activity   Alcohol use: Yes    Comment: seldomly   Drug use: No   Sexual activity: Not Currently    Birth control/protection: Abstinence  Other Topics Concern   Not on file  Social History Narrative   1 daughter born 06/17/13   Married   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[employer]   Enjoys exercise, dancing, hiking, music, reading   Grew up in [place], moved here 19 for school, some family is here and some back home   Social Drivers of Corporate investment banker Strain: Low Risk  (06/08/2023)   Overall Financial Resource Strain (CARDIA)    Difficulty of Paying Living Expenses: Not hard at all  Food Insecurity: No Food Insecurity (06/08/2023)   Hunger Vital Sign    Worried About Running Out of Food in the Last Year: Never true    Ran Out of Food in the Last Year: Never true  Transportation Needs: No Transportation Needs (06/08/2023)   PRAPARE - Administrator, Civil Service (Medical): No    Lack of Transportation (Non-Medical): No  Physical Activity: Sufficiently Active (06/08/2023)   Exercise Vital Sign    Days of Exercise per Week: 4 days    Minutes of Exercise per Session: 60 min  Stress: No Stress Concern Present (06/08/2023)   [employer] of Occupational Health - Occupational Stress Questionnaire    Feeling of Stress : Only a little  Social Connections: Moderately Integrated (06/08/2023)   Social Connection and Isolation Panel [NHANES]    Frequency of Communication with Friends and Family: More than three times a week    Frequency of Social Gatherings with Friends and Family: Once a week    Attends Religious Services: 1 to 4 times per year    Active Member of [employer] or Organizations: Yes    Attends Banker Meetings: 1 to 4 times per year    Marital Status: Divorced  Catering manager Violence: Not on file    Outpatient Medications Prior to  Visit  Medication Sig Dispense Refill   B Complex Vitamins (VITAMIN-B COMPLEX) TABS Take 1 tablet by mouth daily.     Cholecalciferol (VITAMIN D) 50 MCG (2000 UT) tablet Take 2,000 Units by mouth daily.     Multiple Vitamin (MULTIVITAMIN WITH MINERALS) TABS tablet Take 1 tablet by mouth daily.     omeprazole (PRILOSEC) 20 MG capsule Take 20-40 mg by mouth as  needed.     tirzepatide (MOUNJARO) 5 MG/0.5ML Pen Inject 5 mg into the skin once a week. 6 mL 1   No facility-administered medications prior to visit.    Allergies  Allergen Reactions   Asa [Aspirin] Other (See Comments)    gastriitis   Belviq [Lorcaserin Hcl] Other (See Comments)    Dizziness, nausea    ROS    See HPI Objective:    Physical Exam Constitutional:      General: She is not in acute distress.    Appearance: Normal appearance. She is well-developed.  HENT:     Head: Normocephalic and atraumatic.     Right Ear: External ear normal.     Left Ear: External ear normal.  Eyes:     General: No scleral icterus. Neck:     Thyroid: No thyromegaly.  Cardiovascular:     Rate and Rhythm: Normal rate and regular rhythm.     Heart sounds: Normal heart sounds. No murmur heard. Pulmonary:     Effort: Pulmonary effort is normal. No respiratory distress.     Breath sounds: Normal breath sounds. No wheezing.  Musculoskeletal:     Cervical back: Neck supple.  Skin:    General: Skin is warm and dry.  Neurological:     Mental Status: She is alert and oriented to person, place, and time.  Psychiatric:        Mood and Affect: Mood normal.        Behavior: Behavior normal.        Thought Content: Thought content normal.        Judgment: Judgment normal.      BP (!) 94/56 (BP Location: Right Arm, Patient Position: Sitting, Cuff Size: Normal)   Pulse 75   Temp (!) 97.5 F (36.4 C) (Oral)   Resp 16   Ht 5\' 3"  (1.6 m)   Wt 162 lb (73.5 kg)   SpO2 100%   BMI 28.70 kg/m  Wt Readings from Last 3 Encounters:   06/08/23 162 lb (73.5 kg)  02/19/23 163 lb (73.9 kg)  12/29/22 160 lb (72.6 kg)       Assessment & Plan:   Problem List Items Addressed This Visit       Unprioritized   Type 2 diabetes mellitus without complications (HCC) - Primary    Diabetes well-controlled with A1c of 5.7%. Mounjaro adjusted to biweekly due to side effects. Nausea managed with omeprazole. Concerns about muscle mass loss addressed with dietary changes. - Continue Mounjaro 5 mg every other week. - Use omeprazole as needed for nausea. - Order microalbumin test. - Monitor muscle mass and adjust diet as needed.       Relevant Orders   Urine Microalbumin w/creat. ratio   Hot flashes    Hot flashes and night sweats possibly related to recent illness.  - Monitor symptoms for patterns related to illness or hormonal changes. - Consider oral contraceptives if symptoms persist.      GERD (gastroesophageal reflux disease)    GERD symptoms resolved with discontinuation of coffee.  - Continue to avoid regular coffee. - Use omeprazole as needed for nausea.      Preventive care up to date. Pt will plan to schedule upcoming  mammogram.  I am having Sara Finley maintain her multivitamin with minerals, Vitamin D, omeprazole, Vitamin-B Complex, and Mounjaro.  No orders of the defined types were placed in this encounter.

## 2023-06-11 DIAGNOSIS — R232 Flushing: Secondary | ICD-10-CM | POA: Insufficient documentation

## 2023-06-11 NOTE — Assessment & Plan Note (Signed)
  Hot flashes and night sweats possibly related to recent illness.  - Monitor symptoms for patterns related to illness or hormonal changes. - Consider oral contraceptives if symptoms persist.

## 2023-06-11 NOTE — Patient Instructions (Signed)
 VISIT SUMMARY:  During your visit, we discussed the management of your diabetes, hot flashes, and general health maintenance. Your diabetes is well-controlled, and we have adjusted your medication schedule to manage side effects. We also addressed your concerns about hot flashes and muscle mass loss.  YOUR PLAN:  -TYPE 2 DIABETES MELLITUS: Type 2 diabetes is a condition where your body does not use insulin properly, leading to high blood sugar levels. Your diabetes is well-controlled with an A1c of 5.7%. Continue taking Mounjaro 5 mg every other week to manage your blood sugar levels. Use omeprazole as needed for nausea. We will order a microalbumin test to check your kidney function and monitor your muscle mass, adjusting your diet as needed.  -GASTROESOPHAGEAL REFLUX DISEASE (GERD): GERD is a condition where stomach acid frequently flows back into the tube connecting your mouth and stomach, causing irritation. Your symptoms have resolved with the switch to decaf coffee. Continue to avoid regular coffee and use omeprazole as needed for nausea.  -MENOPAUSAL SYMPTOMS: Menopausal symptoms, such as hot flashes and night sweats, may be related to illness or hormonal changes. Monitor your symptoms to see if they are related to illness or hormonal changes. If the symptoms persist, we may consider oral contraceptives to help manage them.  -GENERAL HEALTH MAINTENANCE: Your preventive care is up to date. We will schedule a mammogram for you.   INSTRUCTIONS:  Please continue taking Mounjaro 5 mg every other week and use omeprazole as needed for nausea. We will order a microalbumin test to check your kidney function. Avoid regular coffee to manage your GERD symptoms. Monitor your hot flashes and night sweats and let me know if they persist. We will schedule a mammogram for you.

## 2023-06-11 NOTE — Assessment & Plan Note (Addendum)
  GERD symptoms resolved with discontinuation of coffee.  - Continue to avoid regular coffee. - Use omeprazole as needed for nausea.

## 2023-06-11 NOTE — Assessment & Plan Note (Signed)
  Diabetes well-controlled with A1c of 5.7%. Mounjaro adjusted to biweekly due to side effects. Nausea managed with omeprazole. Concerns about muscle mass loss addressed with dietary changes. - Continue Mounjaro 5 mg every other week. - Use omeprazole as needed for nausea. - Order microalbumin test. - Monitor muscle mass and adjust diet as needed.

## 2023-07-16 ENCOUNTER — Other Ambulatory Visit (HOSPITAL_COMMUNITY): Payer: Self-pay

## 2023-08-02 ENCOUNTER — Other Ambulatory Visit (INDEPENDENT_AMBULATORY_CARE_PROVIDER_SITE_OTHER)

## 2023-08-02 DIAGNOSIS — E119 Type 2 diabetes mellitus without complications: Secondary | ICD-10-CM

## 2023-08-03 LAB — MICROALBUMIN / CREATININE URINE RATIO
Creatinine,U: 130.7 mg/dL
Microalb Creat Ratio: UNDETERMINED mg/g (ref 0.0–30.0)
Microalb, Ur: <0.7 mg/dL

## 2023-08-10 ENCOUNTER — Other Ambulatory Visit: Payer: Self-pay | Admitting: Family

## 2023-08-10 DIAGNOSIS — Z1231 Encounter for screening mammogram for malignant neoplasm of breast: Secondary | ICD-10-CM

## 2023-09-06 ENCOUNTER — Other Ambulatory Visit: Payer: Self-pay

## 2023-09-06 ENCOUNTER — Other Ambulatory Visit (HOSPITAL_COMMUNITY): Payer: Self-pay

## 2023-09-06 ENCOUNTER — Other Ambulatory Visit: Payer: Self-pay | Admitting: Family

## 2023-09-06 MED ORDER — MOUNJARO 5 MG/0.5ML ~~LOC~~ SOAJ
5.0000 mg | SUBCUTANEOUS | 1 refills | Status: DC
  Filled 2023-09-06: qty 6, 84d supply, fill #0
  Filled 2023-11-24: qty 6, 84d supply, fill #1

## 2023-09-07 ENCOUNTER — Other Ambulatory Visit: Payer: Self-pay | Admitting: Family

## 2023-09-07 DIAGNOSIS — Z1231 Encounter for screening mammogram for malignant neoplasm of breast: Secondary | ICD-10-CM

## 2023-09-11 ENCOUNTER — Encounter

## 2023-09-11 ENCOUNTER — Ambulatory Visit
Admission: RE | Admit: 2023-09-11 | Discharge: 2023-09-11 | Disposition: A | Discharge status: Home / Self Care | Source: Ambulatory Visit

## 2023-09-11 DIAGNOSIS — Z1231 Encounter for screening mammogram for malignant neoplasm of breast: Secondary | ICD-10-CM | POA: Diagnosis not present

## 2023-09-12 ENCOUNTER — Encounter

## 2023-09-19 ENCOUNTER — Ambulatory Visit: Admitting: Obstetrics & Gynecology

## 2023-09-19 ENCOUNTER — Ambulatory Visit: Admitting: Obstetrics and Gynecology

## 2023-10-15 ENCOUNTER — Other Ambulatory Visit (HOSPITAL_COMMUNITY)
Admission: RE | Admit: 2023-10-15 | Discharge: 2023-10-15 | Disposition: A | Discharge status: Home / Self Care | Source: Ambulatory Visit | Attending: Obstetrics and Gynecology | Admitting: Obstetrics and Gynecology

## 2023-10-15 ENCOUNTER — Other Ambulatory Visit (HOSPITAL_BASED_OUTPATIENT_CLINIC_OR_DEPARTMENT_OTHER): Payer: Self-pay

## 2023-10-15 ENCOUNTER — Ambulatory Visit (INDEPENDENT_AMBULATORY_CARE_PROVIDER_SITE_OTHER): Admitting: Obstetrics and Gynecology

## 2023-10-15 VITALS — BP 102/72 | HR 62 | Ht 64.0 in | Wt 165.0 lb

## 2023-10-15 DIAGNOSIS — Z1331 Encounter for screening for depression: Secondary | ICD-10-CM

## 2023-10-15 DIAGNOSIS — N951 Menopausal and female climacteric states: Secondary | ICD-10-CM

## 2023-10-15 DIAGNOSIS — N3281 Overactive bladder: Secondary | ICD-10-CM | POA: Diagnosis not present

## 2023-10-15 DIAGNOSIS — Z01419 Encounter for gynecological examination (general) (routine) without abnormal findings: Secondary | ICD-10-CM

## 2023-10-15 MED ORDER — LO LOESTRIN FE 1 MG-10 MCG / 10 MCG PO TABS
1.0000 | ORAL_TABLET | Freq: Every day | ORAL | 11 refills | Status: AC
  Filled 2023-10-15 – 2023-11-26 (×2): qty 28, 28d supply, fill #0
  Filled 2023-12-20: qty 28, 28d supply, fill #1
  Filled 2024-01-30: qty 28, 28d supply, fill #0
  Filled 2024-03-03: qty 28, 28d supply, fill #1

## 2023-10-15 NOTE — Progress Notes (Signed)
 ANNUAL EXAM Patient name: Sara Finley MRN [id number]  Date of birth: 06/18/1980 Chief Complaint:   Gynecologic Exam (Perimenopause discussion )  History of Present Illness:   Sara Finley is a 43 y.o. [phone] with Patient's last menstrual period was 10/07/2023 (exact date). being seen today for a routine annual exam.  Current complaints:  Perimenopause symptoms - cycles are most regular but then she will skip 2 cycles in a row. Random when cycles will skip. Also notes intermittent hot flashes, vaginal dryness, dry skin, and hair growth. Of note, had 6 months without a period after removing nexplanon several years ago. 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 Worse when constipated or drinking caffeine. Does struggle with some chronic constipation, recent CSY 2024. Needs to lean forward to have BM. Previously referred to PFPT but was unable to attend due to work schedule   Upstream - 10/15/23 1543       Contraception Wrap Up   End Method Abstinence;Oral Contraceptive    Contraception Counseling Provided No    How was the end contraceptive method provided? Prescription         The pregnancy intention screening data noted above was reviewed. Potential methods of contraception were discussed. The patient elected to proceed with Abstinence; Oral Contraceptive.   Last pap 12/31/20. Results were: NILM w/ HRHPV negative. H/O abnormal pap: yes reports ~20 years ago needed cryo for abnormal pap; an HPV+ pap was documented in history for 2010 but paps reviewed and all normal Last mammogram: 09/11/23. Results were: normal Last colonoscopy: n/a. Results were: normal - hemorrhoids, plan 10 year repeat HPV vaccine: completed     10/15/2023    4:02 PM 02/19/2023    9:32 AM 09/19/2022    8:39 AM 05/09/2021    7:44 AM 07/23/2019    2:39 PM   Depression screen PHQ 2/9  Decreased Interest 0 0 0 0 0  Down, Depressed, Hopeless 0 0 0 0 0  PHQ - 2 Score 0 0 0 0 0  Altered sleeping 2 0 0  1  Tired, decreased energy 1 0 0  0  Change in appetite 0 0 0  0  Feeling bad or failure about yourself  0 0 0  0  Trouble concentrating 0 0 0  0  Moving slowly or fidgety/restless 0 0 0  1  Suicidal thoughts 0 0 0  0  PHQ-9 Score 3 0 0  2  Difficult doing work/chores  Not difficult at all   Not difficult at all        10/15/2023    4:02 PM 09/19/2022    8:39 AM  GAD 7 : Generalized Anxiety Score  Nervous, Anxious, on Edge 0 0  Control/stop worrying 0 0  Worry too much - different things 0 0  Trouble relaxing 1 0  Restless 0 0  Easily annoyed or irritable 1 0  Afraid - awful might happen 0 0  Total GAD 7 Score 2 0     Review of Systems:   Pertinent items are noted in HPI Denies any headaches, blurred vision, fatigue, shortness of breath, chest pain, abdominal pain, abnormal vaginal discharge/itching/odor/irritation, problems with periods, bowel movements, urination, or intercourse unless otherwise stated above. Pertinent History Reviewed:  Reviewed past medical,surgical, social and family history.  Reviewed problem list, medications  and allergies. Physical Assessment:   Vitals:   10/15/23 1559  BP: 102/72  Pulse: 62  Weight: 165 lb 0.6 oz (74.9 kg)  Height: 5' 4 (1.626 m)  Body mass index is 28.33 kg/m.        Physical Examination:   General appearance - well appearing, and in no distress  Mental status - alert, oriented to person, place, and time  Chest - respiratory effort normal  Heart - normal peripheral perfusion  Breasts - breasts appear normal, no suspicious masses, no skin or nipple changes or axillary nodes  Abdomen - soft, nontender, nondistended, no masses or organomegaly  Pelvic - VULVA: normal appearing vulva with no masses, tenderness or lesions  VAGINA: normal appearing vagina with normal color and  discharge, no lesions  CERVIX: normal appearing cervix without discharge or lesions, no CMT  Thin prep pap is done with HR HPV cotesting  UTERUS: uterus is felt to be normal size, shape, consistency and nontender   ADNEXA: No adnexal masses or tenderness noted.  Chaperone present for exam  No results found for this or any previous visit (from the past 24 hours).  Assessment & Plan:  1) Well-Woman Exam Mammogram: in 1 year or sooner if problems Colonoscopy: due 2034 or sooner if problems Pap: Collected - if normal, discussed q3 vs q5 paps, will space to q5 Gardasil: Completed Discussed vaccine care gaps  2) Menopause transition Discussed several symptoms can be attributed to menopause transition. She does not have any contraindications to estrogen. Was interested in combipatch (discussed dosing may be too low for perimenopause), but it's not covered by insurance. Discussed contraceptive patch, OCP, or menopause dose estradiol  patches w/ IUD or PO progestin.  Opts for lo loestrin   - if issues with insurance coverage, will send junel   3) OAB Discussed diet changes, bladder training, PFPT Discussed role for constipation and pelvic floor dysfunction If no improvement, could consider myrbetriq  Labs/procedures today:   Orders Placed This Encounter  Procedures   Ambulatory referral to Physical Therapy   Meds:  Meds ordered this encounter  Medications   Norethindrone-Ethinyl Estradiol -Fe Biphas (LO LOESTRIN FE ) 1 MG-10 MCG / 10 MCG tablet    Sig: Take 1 tablet by mouth daily.    Dispense:  28 tablet    Refill:  11   Follow-up: Return in about 3 months (around 01/15/2024) for follow up menopause/bladder symptoms.  Kieth JAYSON Carolin, MD 10/15/2023 5:29 PM

## 2023-10-15 NOTE — Patient Instructions (Signed)
 FOODS TO AVOID IF YOU HAVE AN OVERACTIVE BLADDER Alcoholic beverages (liquor, beer, wine) Brewer's Yeast Carbonated beverages (soda, seltzer water) Sports Drinks Tea Milk/milk products Sugar & artificial sweeteners Coffee (even decaffeinated) Tea - black or green, regular or decaffeinated Honey Medicines with caffeine Chocolate Tomatoes and tomato-based products Citrus juice & fruits Corn Syrup Highly spiced foods/chilies Raw onion Saccharin Sour cream Soy sauce Strawberries Vinegar Vitamins buffered with aspartame  OVERACTIVE BLADDER DIET BETTER FOODS TO INCORPORATE Lean Proteins - fish, chicken breast, [place], low-fat beef, and pork are good options. Eggs are also a good source of protein if you're trying to avoid meat. Fiber-Rich Foods - these foods are filling and can help prevent constipation, which can put extra pressure on your bladder. Almonds, oats, pears, raspberries lentils, and beans are all good options when you want to add more fiber to your diet. Fruits - while some fruits, especially citrus, can irritate the bladder, it's still important to incorporate them into your diet. Bananas, apples, grapes, coconut, and watermelon are good options for those with overactive bladder. Vegetables - Leafy greens, like kale, lettuce, cucumber, squash, potatoes, broccoli, carrots, celery and bell peppers. Nuts Whole grains, like oats, barley, farro, and quinoa (also a great protein). You may wish to eliminate all the foods on the "do not eat" list, then slowly reintroduce them back into your diet one by one to determine which ones your bladder finds irritating.    What is bladder training?  Bladder training means changing your habits to better control your bladder. It can help with urinary problems like:  ?Frequency - This means having to use the toilet very frequently.  ?Urgency - This means suddenly feeling the need to urinate right away.  ?Leakage - This is when urgency  leads to actually leaking urine. It is also called urge incontinence.  What is a bladder diary?  Bladder training involves trying to increase the amount of time between trips to the toilet. A bladder diary is a way to keep track of how often you go (form 1). Doctors sometimes call this a voiding diary.  In the diary, write down:  ?The time you urinate  ?The amount of urine, if your doctor asked you to measure this  ?If you had any leaking, how much (small, medium, or large amount), and what you were doing when the leakage happened  ?The amounts and types of fluids you drink  ?Any other symptoms you have   How do I train my bladder?  Once you have an idea of how often you are urinating, try to wait a little longer between trips to the toilet. This should be a gradual process:  ?First, slightly increase the amount of time between bathroom visits. For example, if you normally go every hour, add 15 minutes. This means trying to wait 1 hour and 15 minutes between trips to the bathroom.  ?Keep following this schedule for several days. If you can do this for 3 or 4 days without problems, increase the time again. For example, you can add 15 more minutes between trips to the bathroom.  ?Keep doing this until you can wait at least 2 to 3 hours between bathroom visits. It can take time to get to this point. Some people notice improvement within a few weeks of bladder training. But it can take longer.  Keep filling out your bladder diary as you work on bladder training. This will help you see improvement over time. The process might take several  weeks.   What else can I do to help with urgency?  Part of bladder training involves learning to control the urge to urinate and make your bladder wait. Some things you can do to help with this:  ?Squeeze your pelvic muscles - These include the muscles that control the flow of urine. Try squeezing the muscles and then relaxing them. Repeat this  several times.  ?Change positions - It might help to cross your legs or sit on a firm surface.  ?Distract your mind - Try to think about something else until the urge passes.  ?Relax - Stay still when you get the urge to urinate. It might help to do deep breathing exercises. Do not rush to the toilet when it is time to go.   What else should I know?  These tips might help with bladder training:  ?Try to only urinate when you actually need to go - For example, avoid going to the bathroom before leaving home just in case. This can train your brain to think that your bladder is full when it really isn't.  ?Avoid drinking fluids soon before bedtime - This can lower the chances that you will need to urinate during the night.  ?Limit or avoid alcohol and caffeine - Some people find that these things make them need to urinate more.  When should I call the doctor?  Call your doctor or nurse if:  ?Your urinary symptoms are not getting better or are getting worse.  ?You are having trouble with your training schedule - Bladder training can be frustrating and take time. But don't give up. Your doctor or nurse can help you.  ?You have other problems with urinating, such as pain or burning, not being able to urinate, or seeing blood in your urine.

## 2023-10-16 ENCOUNTER — Encounter: Payer: Self-pay | Admitting: Obstetrics and Gynecology

## 2023-10-24 ENCOUNTER — Ambulatory Visit: Payer: Self-pay | Admitting: Obstetrics and Gynecology

## 2023-10-26 ENCOUNTER — Other Ambulatory Visit (HOSPITAL_BASED_OUTPATIENT_CLINIC_OR_DEPARTMENT_OTHER): Payer: Self-pay

## 2023-10-26 LAB — CYTOLOGY - PAP
Comment: NEGATIVE
Diagnosis: REACTIVE
Diagnosis: REACTIVE

## 2023-11-24 ENCOUNTER — Other Ambulatory Visit (HOSPITAL_COMMUNITY): Payer: Self-pay

## 2023-11-26 ENCOUNTER — Other Ambulatory Visit: Payer: Self-pay

## 2023-11-27 ENCOUNTER — Other Ambulatory Visit (HOSPITAL_COMMUNITY): Payer: Self-pay

## 2023-12-11 ENCOUNTER — Ambulatory Visit: Admitting: Family

## 2023-12-11 ENCOUNTER — Other Ambulatory Visit (HOSPITAL_BASED_OUTPATIENT_CLINIC_OR_DEPARTMENT_OTHER): Payer: Self-pay

## 2023-12-11 ENCOUNTER — Ambulatory Visit (HOSPITAL_BASED_OUTPATIENT_CLINIC_OR_DEPARTMENT_OTHER)
Admission: RE | Admit: 2023-12-11 | Discharge: 2023-12-11 | Disposition: A | Discharge status: Home / Self Care | Source: Ambulatory Visit | Attending: Family | Admitting: Family

## 2023-12-11 ENCOUNTER — Encounter: Payer: Self-pay | Admitting: Family

## 2023-12-11 VITALS — BP 108/71 | HR 64 | Temp 99.6°F | Resp 16 | Ht 64.0 in | Wt 169.0 lb

## 2023-12-11 DIAGNOSIS — E119 Type 2 diabetes mellitus without complications: Secondary | ICD-10-CM

## 2023-12-11 DIAGNOSIS — K625 Hemorrhage of anus and rectum: Secondary | ICD-10-CM

## 2023-12-11 DIAGNOSIS — E559 Vitamin D deficiency, unspecified: Secondary | ICD-10-CM

## 2023-12-11 DIAGNOSIS — Z7985 Long-term (current) use of injectable non-insulin antidiabetic drugs: Secondary | ICD-10-CM | POA: Diagnosis not present

## 2023-12-11 DIAGNOSIS — R7611 Nonspecific reaction to tuberculin skin test without active tuberculosis: Secondary | ICD-10-CM | POA: Insufficient documentation

## 2023-12-11 DIAGNOSIS — R232 Flushing: Secondary | ICD-10-CM | POA: Diagnosis not present

## 2023-12-11 DIAGNOSIS — K219 Gastro-esophageal reflux disease without esophagitis: Secondary | ICD-10-CM | POA: Diagnosis not present

## 2023-12-11 DIAGNOSIS — Z0000 Encounter for general adult medical examination without abnormal findings: Secondary | ICD-10-CM

## 2023-12-11 DIAGNOSIS — Z23 Encounter for immunization: Secondary | ICD-10-CM

## 2023-12-11 MED ORDER — TIRZEPATIDE 7.5 MG/0.5ML ~~LOC~~ SOAJ
7.5000 mg | SUBCUTANEOUS | 0 refills | Status: AC
  Filled 2023-12-11 – 2023-12-20 (×2): qty 6, 84d supply, fill #0
  Filled 2023-12-29: qty 2, 28d supply, fill #0
  Filled 2024-01-07 – 2024-01-30 (×2): qty 6, 84d supply, fill #0
  Filled 2024-02-04: qty 2, 28d supply, fill #0
  Filled 2024-03-03: qty 2, 28d supply, fill #1

## 2023-12-11 MED ORDER — VITAMIN D3 125 MCG (5000 UT) PO CAPS: 5000.0000 [IU] | ORAL_CAPSULE | ORAL | Status: AC

## 2023-12-11 NOTE — Assessment & Plan Note (Signed)
Stable with prn prilosec.

## 2023-12-11 NOTE — Progress Notes (Signed)
 Subjective:     Patient ID: Sara Finley, female    DOB: [birth year], 43 y.o.   MRN: [id number]  Chief Complaint  Patient presents with   Diabetes    Here for follow up    Diabetes Pertinent negatives for hypoglycemia include no headaches or nervousness/anxiousness. Pertinent negatives for diabetes include no blurred vision and no weight loss.    Discussed the use of AI scribe software for clinical note transcription with the patient, who gave verbal consent to proceed.  History of Present Illness  Sara Finley is a 43 year old female who presents for a follow-up visit and annual physical exam.  She is on Mounjaro  with mild constipation managed by Colace as needed. Her last A1c was 5.6, and she is fasting for an update. She notes slight weight gain and feels her appetite is returning. She is considering increasing her dose to 7.5 mg due to a plateau in weight loss and struggles with maintaining exercise routines.  She experiences perimenopausal symptoms including hot flashes, irregular menstrual cycles, night sweats, and hair loss. She was prescribed a low-dose birth control pill for symptom management but has not started it due to concerns about nausea experienced with birth control pills in the past.  She has stress incontinence and is scheduled to start pelvic floor physical therapy next month. She wears a pad at all times due to urgency and occasional incontinence, especially when constipated.  Her exercise routine includes cardio and weight training three times a week, which she finds to be a stress reliever. She maintains a consistent schedule by exercising immediately after work before picking up her daughter.  She takes Prilosec over the counter as needed, 5000 IU of vitamin D  two to three times a week, and a multivitamin twice a week. She notes significant improvement in energy levels with vitamin D  supplementation. No current cough, cold symptoms, swelling in legs, hearing  or vision concerns, skin rash, or moles. She reports mild constipation and no frequent headaches, depression, or anxiety.  Immunizations:  declines prevnar, flu shot today Diet: healthy Exercise: 3x a week elliptical, walking, rowing, 3 days weight training Pap Smear:  8/25 negative Mammogram:  7/15 negative Vision: up to date Dental: up to date      Health Maintenance Due  Topic Date Due   COVID-19 Vaccine (6 - 2025-26 season) 10/29/2023   HEMOGLOBIN A1C  12/01/2023    Past Medical History:  Diagnosis Date   Abnormal Pap smear of cervix 2010   + HPV. normal pap since    GERD (gastroesophageal reflux disease)    History of Helicobacter pylori infection    Hyperglycemia    Prediabetes    Rectal bleeding    Stress incontinence    Thyroid  nodule    Type 2 diabetes mellitus without complications (HCC) 01/09/2014   A1C 6.0 8/15    Past Surgical History:  Procedure Laterality Date   APPENDECTOMY     BREAST ENHANCEMENT SURGERY Bilateral    2010-saline   BREAST IMPLANT REMOVAL Bilateral    pt states summer of  2021, removed   COLPOSCOPY  ~2010   Normal   UMBILICAL HERNIA REPAIR  @ age 28    Family History  Problem Relation Age of Onset   Hypertension Mother    Hyperlipidemia Mother    Diabetes Mellitus II Mother    Hypertension Father    Hyperlipidemia Father    Diabetes Father        ?pre-diabetes  Benign prostatic hyperplasia Father    Sleep apnea Father    Asthma Father    Cataracts Father    Anemia Sister        hyperglycemia   Sickle cell trait Sister    Goiter Sister    Sleep apnea Brother    Hypertension Paternal Uncle    Heart disease Paternal Uncle 76       MI/CABG x 4- died after failed bipass   Liver disease Paternal Uncle        pt said she thinks it was due to hep C   Hypertension Paternal Grandmother    Stroke Paternal Grandmother    Colon cancer Paternal Grandfather    Esophageal cancer Neg Hx    Rectal cancer Neg Hx    Stomach cancer  Neg Hx     Social History   Socioeconomic History   Marital status: Divorced    Spouse name: Not on file   Number of children: Not on file   Years of education: Not on file   Highest education level: Master's degree (e.g., MA, MS, MEng, MEd, MSW, MBA)  Occupational History   Not on file  Tobacco Use   Smoking status: Never   Smokeless tobacco: Never  Vaping Use   Vaping status: Never Used  Substance and Sexual Activity   Alcohol use: Yes    Comment: seldomly   Drug use: No   Sexual activity: Not Currently    Birth control/protection: Abstinence  Other Topics Concern   Not on file  Social History Narrative   1 daughter born 06/17/13   Married   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[employer]   Enjoys exercise, dancing, hiking, music, reading   Grew up in [place], moved here 19 for school, some family is here and some back home   Social Drivers of Corporate investment banker Strain: Low Risk  (12/10/2023)   Overall Financial Resource Strain (CARDIA)    Difficulty of Paying Living Expenses: Not hard at all  Food Insecurity: No Food Insecurity (12/10/2023)   Hunger Vital Sign    Worried About Running Out of Food in the Last Year: Never true    Ran Out of Food in the Last Year: Never true  Transportation Needs: No Transportation Needs (12/10/2023)   PRAPARE - Administrator, Civil Service (Medical): No    Lack of Transportation (Non-Medical): No  Physical Activity: Sufficiently Active (12/10/2023)   Exercise Vital Sign    Days of Exercise per Week: 3 days    Minutes of Exercise per Session: 60 min  Stress: No Stress Concern Present (12/10/2023)   [employer] of Occupational Health - Occupational Stress Questionnaire    Feeling of Stress: Only a little  Social Connections: Moderately Integrated (12/10/2023)   Social Connection and Isolation Panel    Frequency of Communication with Friends and Family: More than three times a week    Frequency of Social  Gatherings with Friends and Family: Once a week    Attends Religious Services: More than 4 times per year    Active Member of [employer] or Organizations: Yes    Attends Engineer, structural: More than 4 times per year    Marital Status: Divorced  Catering manager Violence: Not on file    Outpatient Medications Prior to Visit  Medication Sig Dispense Refill   B Complex Vitamins (VITAMIN-B COMPLEX) TABS Take 1 tablet by mouth daily.  Multiple Vitamin (MULTIVITAMIN WITH MINERALS) TABS tablet Take 1 tablet by mouth daily.     Norethindrone-Ethinyl Estradiol -Fe Biphas (LO LOESTRIN FE ) 1 MG-10 MCG / 10 MCG tablet Take 1 tablet by mouth daily. 28 tablet 11   omeprazole  (PRILOSEC) 20 MG capsule Take 20-40 mg by mouth as needed.     Cholecalciferol (VITAMIN D ) 50 MCG (2000 UT) tablet Take 2,000 Units by mouth daily.     tirzepatide  (MOUNJARO ) 5 MG/0.5ML Pen Inject 5 mg into the skin once a week. 6 mL 1   No facility-administered medications prior to visit.    Allergies  Allergen Reactions   Asa [Aspirin] Other (See Comments)    gastriitis   Belviq [Lorcaserin  Hcl] Other (See Comments)    Dizziness, nausea    Review of Systems  Constitutional:  Negative for weight loss.  HENT:  Negative for congestion and hearing loss.   Eyes:  Negative for blurred vision.  Respiratory:  Negative for cough.   Cardiovascular:  Negative for leg swelling.  Gastrointestinal:  Positive for constipation (mild). Negative for diarrhea.  Genitourinary:  Negative for dysuria and frequency.  Musculoskeletal:  Negative for joint pain and myalgias.  Skin:  Negative for rash.  Neurological:  Negative for headaches.  Psychiatric/Behavioral:  Negative for depression. The patient is not nervous/anxious.        Objective:    Physical Exam   BP 108/71 (BP Location: Right Leg, Patient Position: Sitting, Cuff Size: Normal)   Pulse 64   Temp 99.6 F (37.6 C) (Oral)   Resp 16   Ht 5' 4 (1.626 m)   Wt  169 lb (76.7 kg)   SpO2 100%   BMI 29.01 kg/m  Wt Readings from Last 3 Encounters:  12/11/23 169 lb (76.7 kg)  10/15/23 165 lb 0.6 oz (74.9 kg)  06/08/23 162 lb (73.5 kg)  Physical Exam  Constitutional: She is oriented to person, place, and time. She appears well-developed and well-nourished. No distress.  HENT:  Head: Normocephalic and atraumatic.  Right Ear: Tympanic membrane and ear canal normal.  Left Ear: Tympanic membrane and ear canal normal.  Mouth/Throat: Oropharynx is clear and moist.  Eyes: Pupils are equal, round, and reactive to light. No scleral icterus.  Neck: Normal range of motion. No thyromegaly present.  Cardiovascular: Normal rate and regular rhythm.   No murmur heard. Pulmonary/Chest: Effort normal and breath sounds normal. No respiratory distress. He has no wheezes. She has no rales. She exhibits no tenderness.  Abdominal: Soft. Bowel sounds are normal. She exhibits no distension and no mass. There is no tenderness. There is no rebound and no guarding.  Musculoskeletal: She exhibits no edema.  Lymphadenopathy:    She has no cervical adenopathy.  Neurological: She is alert and oriented to person, place, and time. She has normal patellar reflexes. She exhibits normal muscle tone. Coordination normal.  Skin: Skin is warm and dry.  Psychiatric: She has a normal Finley and affect. Her behavior is normal. Judgment and thought content normal.  Breast/Pelvic: deferred           Assessment & Plan:        Assessment & Plan:   Problem List Items Addressed This Visit       Unprioritized   Vitamin D  deficiency   Continues vitamin D  supplement.       Relevant Medications   Cholecalciferol (VITAMIN D3) 125 MCG (5000 UT) CAPS (Start on 12/12/2023)   Type 2 diabetes mellitus without complications (HCC)  Lab Results  Component Value Date   HGBA1C 5.6 06/01/2023   HGBA1C 5.7 (H) 12/01/2022   HGBA1C 6.0 12/12/2021   Lab Results  Component Value Date    MICROALBUR <0.7 08/02/2023   LDLCALC 100 06/01/2023   CREATININE 0.9 06/01/2023   A1C has been excellent since starting Mounjaro . She has gained back a few pounds and is noticing some return of her appetite. Will increase mounjaro  from 5 mg to 7.5 mg to see if it will provider her with some additional appetite reduction/weight loss.       Relevant Medications   tirzepatide  (MOUNJARO ) 7.5 MG/0.5ML Pen   Other Relevant Orders   HgB A1c   Lipid panel   Comp Met (CMET)   RESOLVED: Rectal bleeding   Resolved, had internal hemorrhoids on colonoscopy last year.       Preventative health care - Primary   Pap/mammo up to date. Declines prevnar.  Flu shot today.  Continue healthy diet/exercise efforts.       PPD positive   Requesting CXR for school.  States she went to the [facility] Department and was advised that she did not need treatment for latent TB.       Relevant Orders   DG Chest 2 View   Hot flashes   She will start OCP.       GERD (gastroesophageal reflux disease)   Stable with prn prilosec.       Other Visit Diagnoses       Needs flu shot       Relevant Orders   Flu vaccine trivalent PF, 6mos and older(Flulaval,Afluria,Fluarix,Fluzone) (Completed)       I have discontinued Cedricka Armendariz's Vitamin D  and Mounjaro . I am also having her start on tirzepatide  and Vitamin D3. Additionally, I am having her maintain her multivitamin with minerals, omeprazole , Vitamin-B Complex, and Lo Loestrin Fe .  Meds ordered this encounter  Medications   tirzepatide  (MOUNJARO ) 7.5 MG/0.5ML Pen    Sig: Inject 7.5 mg into the skin once a week.    Dispense:  6 mL    Refill:  0    Supervising Provider:   DOMENICA BLACKBIRD A [[id number]]   Cholecalciferol (VITAMIN D3) 125 MCG (5000 UT) CAPS    Sig: Take 1 capsule (5,000 Units total) by mouth 3 (three) times a week.    Supervising Provider:   DOMENICA BLACKBIRD A [phone]

## 2023-12-11 NOTE — Assessment & Plan Note (Signed)
 She will start OCP.

## 2023-12-11 NOTE — Patient Instructions (Signed)
 VISIT SUMMARY:  Today, we reviewed your overall health, including your diabetes management, perimenopausal symptoms, and other ongoing concerns. We made some adjustments to your medications and discussed future steps for managing your symptoms.  YOUR PLAN:  TYPE 2 DIABETES MELLITUS: Your diabetes is well-controlled with your current medication, but you've noticed a slight weight gain and increased appetite. -Increase your Linjara dose to 7.5 mg. -Monitor for constipation and manage it with dietary fiber and Colace as needed.  CONSTIPATION: You have mild constipation, likely due to your diabetes medication. -Continue managing your diet with increased fiber intake. -Use Colace as needed for constipation.  PERIMENOPAUSAL SYMPTOMS: You are experiencing symptoms like hot flashes, irregular cycles, night sweats, and hair loss. -Start low-dose birth control pills after adjusting to the higher Mounjaro  dose.  OVERACTIVE BLADDER: You have symptoms of an overactive bladder, likely related to a weakened pelvic floor. -Begin pelvic floor physical therapy next month as recommended by your GYN.  GASTROESOPHAGEAL REFLUX DISEASE (GERD): You manage your GERD symptoms with over-the-counter Prilosec. -Continue taking Prilosec as needed.  VITAMIN D  DEFICIENCY, TREATED: You have been taking vitamin D  supplements and have noticed improved energy levels. -Continue taking 5000 IU of vitamin D  two to three times a week.

## 2023-12-11 NOTE — Assessment & Plan Note (Signed)
 Pap/mammo up to date. Declines prevnar.  Flu shot today.  Continue healthy diet/exercise efforts.

## 2023-12-11 NOTE — Assessment & Plan Note (Addendum)
 Lab Results  Component Value Date   HGBA1C 5.6 06/01/2023   HGBA1C 5.7 (H) 12/01/2022   HGBA1C 6.0 12/12/2021   Lab Results  Component Value Date   MICROALBUR <0.7 08/02/2023   LDLCALC 100 06/01/2023   CREATININE 0.9 06/01/2023   A1C has been excellent since starting Mounjaro . She has gained back a few pounds and is noticing some return of her appetite. Will increase mounjaro  from 5 mg to 7.5 mg to see if it will provider her with some additional appetite reduction/weight loss.

## 2023-12-11 NOTE — Assessment & Plan Note (Signed)
Continues vitamin D supplement.  

## 2023-12-11 NOTE — Assessment & Plan Note (Addendum)
 Requesting CXR for school.  States she went to the [facility] Department and was advised that she did not need treatment for latent TB.

## 2023-12-11 NOTE — Assessment & Plan Note (Signed)
 Resolved, had internal hemorrhoids on colonoscopy last year.

## 2023-12-12 ENCOUNTER — Ambulatory Visit: Payer: Self-pay | Admitting: Family

## 2023-12-12 ENCOUNTER — Encounter: Payer: Self-pay | Admitting: Family

## 2023-12-12 LAB — COMPREHENSIVE METABOLIC PANEL WITH GFR
ALT: 9 U/L (ref 0–35)
AST: 18 U/L (ref 0–37)
Albumin: 4.2 g/dL (ref 3.5–5.2)
Alkaline Phosphatase: 42 U/L (ref 39–117)
BUN: 9 mg/dL (ref 6–23)
CO2: 28 meq/L (ref 19–32)
Calcium: 8.8 mg/dL (ref 8.4–10.5)
Chloride: 102 meq/L (ref 96–112)
Creatinine, Ser: 0.78 mg/dL (ref 0.40–1.20)
GFR: 92.81 mL/min (ref >60.00)
Glucose, Bld: 74 mg/dL (ref 70–99)
Potassium: 4.1 meq/L (ref 3.5–5.1)
Sodium: 137 meq/L (ref 135–145)
Total Bilirubin: 0.4 mg/dL (ref 0.2–1.2)
Total Protein: 7 g/dL (ref 6.0–8.3)

## 2023-12-12 LAB — HEMOGLOBIN A1C: Hgb A1c MFr Bld: 5.8 % (ref 4.6–6.5)

## 2023-12-12 LAB — LIPID PANEL
Cholesterol: 150 mg/dL (ref 0–200)
HDL: 58.3 mg/dL (ref >39.00)
LDL Cholesterol: 83 mg/dL (ref 0–99)
NonHDL: 91.55
Total CHOL/HDL Ratio: 3
Triglycerides: 42 mg/dL (ref 0.0–149.0)
VLDL: 8.4 mg/dL (ref 0.0–40.0)

## 2023-12-20 ENCOUNTER — Other Ambulatory Visit: Payer: Self-pay

## 2023-12-31 ENCOUNTER — Other Ambulatory Visit (HOSPITAL_COMMUNITY): Payer: Self-pay

## 2023-12-31 ENCOUNTER — Other Ambulatory Visit: Payer: Self-pay

## 2024-01-07 ENCOUNTER — Other Ambulatory Visit (HOSPITAL_COMMUNITY): Payer: Self-pay

## 2024-01-08 ENCOUNTER — Other Ambulatory Visit (HOSPITAL_COMMUNITY): Payer: Self-pay

## 2024-01-09 NOTE — Therapy (Signed)
 OUTPATIENT PHYSICAL THERAPY FEMALE PELVIC EVALUATION   Patient Name: Sara Finley MRN: [id number] DOB:Jun 09, 1980, 43 y.o., female Today's Date: 01/10/2024  END OF SESSION:  PT End of Session - 01/10/24 1605     Visit Number 1    Date for Recertification  07/09/24    Authorization Type [employer]    PT Start Time 1600    PT Stop Time 1645    PT Time Calculation (min) 45 min    Activity Tolerance Patient tolerated treatment well    Behavior During Therapy [facility] for tasks assessed/performed          Past Medical History:  Diagnosis Date   Abnormal Pap smear of cervix 2010   + HPV. normal pap since    GERD (gastroesophageal reflux disease)    History of Helicobacter pylori infection    Hyperglycemia    Prediabetes    Rectal bleeding    Stress incontinence    Thyroid  nodule    Type 2 diabetes mellitus without complications (HCC) 01/09/2014   A1C 6.0 8/15   Past Surgical History:  Procedure Laterality Date   APPENDECTOMY     BREAST ENHANCEMENT SURGERY Bilateral    2010-saline   BREAST IMPLANT REMOVAL Bilateral    pt states summer of  2021, removed   COLPOSCOPY  ~2010   Normal   UMBILICAL HERNIA REPAIR  @ age 69   Patient Active Problem List   Diagnosis Date Noted   PPD positive 12/11/2023   Hot flashes 06/11/2023   Vertigo 11/02/2020   Thyroid  nodule 10/19/2017   Hirsutism 09/29/2015   Vitamin D  deficiency 09/29/2015   Overweight with body mass index (BMI) of 29 to 29.9 in adult 11/07/2014   Preventative health care 05/01/2014   GERD (gastroesophageal reflux disease) 05/01/2014   Allergic rhinitis 05/01/2014   Type 2 diabetes mellitus without complications (HCC) 01/09/2014    PCP: Daryl Setter, NP  REFERRING PROVIDER: Erik Kieth BROCKS, MD   REFERRING DIAG: N32.81 (ICD-10-CM) - Overactive bladder   THERAPY DIAG:  Cramp and spasm - Plan: PT plan of care cert/re-cert  Other lack of coordination - Plan: PT plan of care  cert/re-cert  Rationale for Evaluation and Treatment: Rehabilitation  ONSET DATE: 2015  SUBJECTIVE:                                                                                                                                                                                           SUBJECTIVE STATEMENT: Patient has had issues since she had her daughter.  Fluid intake:   PERTINENT HISTORY:  Medications for current condition: none Surgeries: umbilical hernia Other:  See above Sexual abuse: No  PAIN:  Are you having pain? No  PRECAUTIONS: None  RED FLAGS: None   WEIGHT BEARING RESTRICTIONS: No  FALLS:  Has patient fallen in last 6 months? No  OCCUPATION: Nurse practitioner  ACTIVITY LEVEL : cardio, weight training  PLOF: Independent  PATIENT GOALS: not have to wear a pad and reduce urgency   BOWEL MOVEMENT: Pain with bowel movement: No Type of bowel movement:Type (Bristol Stool Scale) Type 3,4, Frequency every 2 days and leans forward, and Strain yes sometimes Fully empty rectum: Yes: sometimes has the sensation to have a bowel movement Leakage: No                                                 Bowel urgency: no Pads: No Fiber supplement/laxative No  URINATION: Pain with urination: No Fully empty bladder: Yes: sometimes, if have a bowel movement and not able to fully empty the bladder                                         Post-void dribble: No Stream: Strong, splays out Urgency: Yes , if she stand up she has to rush to the bathroom Frequency:during the day 2-3 hours but if she has caffeine she will urinate every 15 minutes or when she is constipated                                                        Nocturia: No2   Leakage: Urge to void, Walking to the bathroom, Exercise, Lifting, and while she is doing her weight training and has not emptied her bladder Pads/briefs: Yes: 2-3  INTERCOURSE:  Ability to have vaginal penetration Yes  Pain with  intercourse: pressure and feels like something has to move out of the way Dryness: No Climax: not always Marinoff Scale: 0/3  PREGNANCY: Vaginal deliveries 1 Tearing Yes: 2nd degree  PROLAPSE: None    OBJECTIVE:  Note: Objective measures were completed at Evaluation unless otherwise noted.  PATIENT SURVEYS:  PFIQ-7: 44 UIQ-7 43  COGNITION: Overall cognitive status: Within functional limits for tasks assessed     SENSATION: Light touch: Appears intact  FUNCTIONAL TESTS:  Single leg stance:  Rt: no deficits  Lt:no deficits Sit-up test: able to curl up without assistance of arms Squat:no deficits Bed mobility:  GAIT: Assistive device utilized: None  POSTURE: No Significant postural limitations   LUMBARAROM/PROM:full lumbar ROM   LOWER EXTREMITY ROM: Full bilateral hip ROM   LOWER EXTREMITY MMT:  MMT Right eval Left eval  Hip extension 4/5 5/5  Hip abduction 4/5 5/5   (Blank rows = not tested) PALPATION: Pelvic Alignment: ASIS are equal  Abdominal:   Diastasis: No Distortion: No  Breathing: Decreased lower rib cage expansion Scar tissue: No                External Perineal Exam: scar along the right posterior fourchette and tenderness  Internal Pelvic Floor: restrictions on the right side of the bladder and urethra sphincter, burning on the right lower levator ani  Patient confirms identification and approves PT to assess internal pelvic floor and treatment Yes All internal or external pelvic floor assessments and/or treatments are completed with proper hand hygiene and gloves hands. If needed gloves are changed with hand hygiene during patient care time.  PELVIC MMT:   MMT eval  Vaginal 2/5 prior to manual work then after circular with a lift 3/5  (Blank rows = not tested)        TONE: average  PROLAPSE: none  TODAY'S TREATMENT:                                                                                                                               DATE: 01/10/24  EVAL Examination completed, findings reviewed, pt educated on POC, HEP, and female pelvic floor anatomy, reasoning with pelvic floor assessment internally with pt consent. Pt motivated to participate in PT and agreeable to attempt recommendations.     PATIENT EDUCATION:  Education details: educated patient on how to massage the right side of the pelvic floor to reduce restrictions Person educated: Patient Education method: Explanation, Demonstration, Tactile cues, Verbal cues, and Handouts Education comprehension: verbalized understanding, returned demonstration, verbal cues required, tactile cues required, and needs further education  HOME EXERCISE PROGRAM: See above.   ASSESSMENT:  CLINICAL IMPRESSION: Patient is a 43 y.o. female who was seen today for physical therapy evaluation and treatment for Overactive bladder. Patient reports urinary urgency for 10 years. She will have to urinate immediately if she has the urge and will leak urine as she is walking there. She will also leak with lifting weights if she has a full bladder. Her urine will splay out when she urinates. She wears 2-3 pads per night.  Pelvic floor strength was 2/5 but after manual work increased to 3/5 with a circular hug. Patient has restrictions and tenderness along the right lower fourchette where she has a perineal tear from giving birth. She has restrictions around the right side of the bladder and urethra sphincter. Patient urine stream was straight and back after the manual work. Patient will benefit from skilled therapy to reduce restrictions of the tissue and improve the coordination to reduce urinary leakage and urgency.   OBJECTIVE IMPAIRMENTS: decreased coordination, decreased strength, increased fascial restrictions, and increased muscle spasms.   ACTIVITY LIMITATIONS: lifting, continence, and weightlifting  PARTICIPATION LIMITATIONS: driving, shopping,  and community activity  PERSONAL FACTORS: Time since onset of injury/illness/exacerbation are also affecting patient's functional outcome.   REHAB POTENTIAL: Excellent  CLINICAL DECISION MAKING: Stable/uncomplicated  EVALUATION COMPLEXITY: Low   GOALS: Goals reviewed with patient? Yes  SHORT TERM GOALS: Target date: 02/07/24  Patient educated on how to perform manual work to the pelvic floor to reduce restrictions.  Baseline: Goal status: INITIAL  2.  Patient educated on diaphragmatic breathing to relax the  pelvic floor.  Baseline:  Goal status: INITIAL   LONG TERM GOALS: Target date: 07/09/24  Patient independent with advanced HEP for pelvic floor strength and coordination.  Baseline:  Goal status: INITIAL  2.  Patient is able to perform weight lifting with correct pressure management and pelvic floor strength so she is not leaking urine.  Baseline:  Goal status: INITIAL  3.  Patient is able to delay the urgency to urinate and walk to the bathroom slowly without leaking urine.  Baseline:  Goal status: INITIAL  4.  Patient is wearing 1 to no pads due to the reduction of urinary leakage and urgency.  Baseline:  Goal status: INITIAL  5.  Patient is able to take a 1-2 hour car ride without urinary urgency and wait to urinate at the appropriate time.  Baseline:  Goal status: INITIAL   PLAN:  PT FREQUENCY: 1x/week  PT DURATION: 6 months  PLANNED INTERVENTIONS: 97110-Therapeutic exercises, 97530- Therapeutic activity, 97112- Neuromuscular re-education, 97535- Self Care, [id number]- Manual therapy, [id number]- Electrical stimulation (unattended), Patient/Family education, Joint mobilization, Spinal mobilization, and Biofeedback  PLAN FOR NEXT SESSION: manual work to the pelvic floor, urge to void, diaphragmatic breathing, happy baby, pressure management, lower rib mobitliy   Channing Pereyra, PT 01/10/24 5:24 PM'

## 2024-01-10 ENCOUNTER — Other Ambulatory Visit: Payer: Self-pay

## 2024-01-10 ENCOUNTER — Encounter: Payer: Self-pay | Admitting: Physical Therapy

## 2024-01-10 ENCOUNTER — Encounter: Attending: Obstetrics and Gynecology | Admitting: Physical Therapy

## 2024-01-10 DIAGNOSIS — R252 Cramp and spasm: Secondary | ICD-10-CM | POA: Diagnosis not present

## 2024-01-10 DIAGNOSIS — R278 Other lack of coordination: Secondary | ICD-10-CM | POA: Insufficient documentation

## 2024-01-10 DIAGNOSIS — N3281 Overactive bladder: Secondary | ICD-10-CM | POA: Diagnosis not present

## 2024-01-17 ENCOUNTER — Encounter: Admitting: Physical Therapy

## 2024-01-22 ENCOUNTER — Encounter: Payer: Self-pay | Admitting: Physical Therapy

## 2024-01-22 ENCOUNTER — Encounter: Admitting: Physical Therapy

## 2024-01-22 DIAGNOSIS — R278 Other lack of coordination: Secondary | ICD-10-CM | POA: Diagnosis not present

## 2024-01-22 DIAGNOSIS — R252 Cramp and spasm: Secondary | ICD-10-CM | POA: Diagnosis not present

## 2024-01-22 DIAGNOSIS — N3281 Overactive bladder: Secondary | ICD-10-CM | POA: Diagnosis not present

## 2024-01-22 NOTE — Therapy (Signed)
 OUTPATIENT PHYSICAL THERAPY FEMALE PELVIC TREATMENT   Patient Name: Sara Finley MRN: [id number] DOB:[birth year], 43 y.o., female Today's Date: 01/22/2024  END OF SESSION:  PT End of Session - 01/22/24 1509     Visit Number 2    Date for Recertification  07/09/24    Authorization Type [employer]    PT Start Time 1505    PT Stop Time 1550    PT Time Calculation (min) 45 min    Activity Tolerance Patient tolerated treatment well    Behavior During Therapy [facility] for tasks assessed/performed          Past Medical History:  Diagnosis Date   Abnormal Pap smear of cervix 2010   + HPV. 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   Hot flashes 06/11/2023   Vertigo 11/02/2020   Thyroid  nodule 10/19/2017   Hirsutism 09/29/2015   Vitamin D  deficiency 09/29/2015   Overweight with body mass index (BMI) of 29 to 29.9 in adult 11/07/2014   Preventative health care 05/01/2014   GERD (gastroesophageal reflux disease) 05/01/2014   Allergic rhinitis 05/01/2014   Type 2 diabetes mellitus without complications (HCC) 01/09/2014    PCP: Daryl Setter, NP  REFERRING PROVIDER: Erik Kieth BROCKS, MD   REFERRING DIAG: N32.81 (ICD-10-CM) - Overactive bladder   THERAPY DIAG:  Cramp and spasm  Other lack of coordination  Rationale for Evaluation and Treatment: Rehabilitation  ONSET DATE:  2015  SUBJECTIVE:                                                                                                                                                                                           SUBJECTIVE STATEMENT: I did the exercises 1 time. 3 days later the urine stream going forward and more splatter. No changes with the urinary leakage.    PERTINENT HISTORY:  Medications for current condition: none Surgeries: umbilical hernia Other: See above  Sexual abuse: No  PAIN:  Are you having pain? No  PRECAUTIONS: None  RED FLAGS: None   WEIGHT BEARING RESTRICTIONS: No  FALLS:  Has patient fallen in last 6 months? No  OCCUPATION: Nurse practitioner  ACTIVITY LEVEL : cardio, weight training  PLOF: Independent  PATIENT GOALS: not have to wear a pad and reduce urgency   BOWEL MOVEMENT: Pain with bowel movement: No Type of bowel movement:Type (Bristol Stool Scale) Type 3,4, Frequency every 2 days and leans forward, and Strain yes sometimes Fully empty rectum: Yes: sometimes has the sensation to have a bowel movement Leakage: No                                                 Bowel urgency: no Pads: No Fiber supplement/laxative No  URINATION: Pain with urination: No Fully empty bladder: Yes: sometimes, if have a bowel movement and not able to fully empty the bladder                                         Post-void dribble: No Stream: Strong, splays out Urgency: Yes , if she stand up she has to rush to the bathroom Frequency:during the day 2-3 hours but if she has caffeine she will urinate every 15 minutes or when she is constipated                                                        Nocturia: No2   Leakage: Urge to void, Walking to the bathroom, Exercise, Lifting, and while she is doing her weight training and has not emptied her bladder Pads/briefs: Yes: 2-3  INTERCOURSE:  Ability to have vaginal penetration Yes  Pain with intercourse: pressure and feels  like something has to move out of the way Dryness: No Climax: not always Marinoff Scale: 0/3  PREGNANCY: Vaginal deliveries 1 Tearing Yes: 2nd degree  PROLAPSE: None    OBJECTIVE:  Note: Objective measures were completed at Evaluation unless otherwise noted.  PATIENT SURVEYS:  PFIQ-7: 44 UIQ-7 43  COGNITION: Overall cognitive status: Within functional limits for tasks assessed     SENSATION: Light touch: Appears intact  FUNCTIONAL TESTS:  Single leg stance:  Rt: no deficits  Lt:no deficits Sit-up test: able to curl up without assistance of arms Squat:no deficits Bed mobility:  GAIT: Assistive device utilized: None  POSTURE: No Significant postural limitations   LUMBARAROM/PROM:full lumbar ROM   LOWER EXTREMITY ROM: Full bilateral hip ROM   LOWER EXTREMITY MMT:  MMT Right eval Left eval  Hip extension 4/5 5/5  Hip abduction 4/5 5/5   (Blank rows = not tested) PALPATION: Pelvic Alignment: ASIS are equal  Abdominal:   Diastasis: No Distortion: No  Breathing: Decreased lower rib cage expansion Scar tissue: No                External Perineal Exam: scar along the right posterior fourchette and tenderness  Internal Pelvic Floor: restrictions on the right side of the bladder and urethra sphincter, burning on the right lower levator ani  Patient confirms identification and approves PT to assess internal pelvic floor and treatment Yes All internal or external pelvic floor assessments and/or treatments are completed with proper hand hygiene and gloves hands. If needed gloves are changed with hand hygiene during patient care time.  PELVIC MMT:   MMT eval  Vaginal 2/5 prior to manual work then after circular with a lift 3/5  (Blank rows = not tested)        TONE: average  PROLAPSE: none  TODAY'S TREATMENT:   01/22/24 Manual: Soft tissue mobilization: Circular massage to the abdomen to promote peristalic motion to  the intestines Manual work to the diaphragm to improve diaphragmatic breathing Myofascial release: Tissue rolling of the abdomen to reduce fascial restrictions Fascial release around the areas of the kidney and ureters Fascial release of the urachus ligament and around the area of the bladder Neuromuscular re-education: Core retraining: Lunges with tactile cues to engage the lower abdominals and pelvic floor keeping spinal neutral Squats with tactile cues to engage the lower abdominals and pelvic floor keeping spinal neutral Supine transverse abdominus contraction 10 x Pelvic floor contraction training: Standing pelvic floor contraction with verbal cues Self-care: Discussed with patient on how the coffee irritates her bladder and to drink more water to reduce the irritation. She uses the coffee to help with the constipation.  Educated patient on how to perform abdominal massage for constipation.                                                                                                                                DATE: 01/10/24  EVAL Examination completed, findings reviewed, pt educated on POC, HEP, and female pelvic floor anatomy, reasoning with pelvic floor assessment internally with pt consent. Pt motivated to participate in PT and agreeable to attempt recommendations.     PATIENT EDUCATION:  01/22/24 Education details: Access Code: [email or web address] Person educated: Patient Education method: Explanation, Demonstration, Tactile cues, Verbal cues, and Handouts Education comprehension: verbalized understanding, returned demonstration, verbal cues required, tactile cues required, and needs further education  HOME EXERCISE PROGRAM: 11/ Access Code: [email or web address] URL: [email or web address] Date: 01/22/2024 Prepared by: Channing Pereyra  Exercises - Standard Lunge  - 1 x daily - 7 x weekly - 3 sets - 10 reps - Squat  - 1 x daily - 7 x weekly - 3 sets - 10 reps - Seated Pelvic  Floor Contraction  - 3 x daily - 7 x weekly - 1 sets - 10 reps - 5 sec hold  Patient Education - Abdominal Massage for Constipation    ASSESSMENT:  CLINICAL IMPRESSION: Patient is a 43 y.o. female who was seen today for physical therapy  treatment for Overactive bladder. Patient had less restrictions in the abdomen after manual work. She was able to feel the lower abdominal with spinal  neutral while performing squats and lunges. She understands how to perform the abdominal massage.  Patient will benefit from skilled therapy to reduce restrictions of the tissue and improve the coordination to reduce urinary leakage and urgency.   OBJECTIVE IMPAIRMENTS: decreased coordination, decreased strength, increased fascial restrictions, and increased muscle spasms.   ACTIVITY LIMITATIONS: lifting, continence, and weightlifting  PARTICIPATION LIMITATIONS: driving, shopping, and community activity  PERSONAL FACTORS: Time since onset of injury/illness/exacerbation are also affecting patient's functional outcome.   REHAB POTENTIAL: Excellent  CLINICAL DECISION MAKING: Stable/uncomplicated  EVALUATION COMPLEXITY: Low   GOALS: Goals reviewed with patient? Yes  SHORT TERM GOALS: Target date: 02/07/24  Patient educated on how to perform manual work to the pelvic floor to reduce restrictions.  Baseline: Goal status: INITIAL  2.  Patient educated on diaphragmatic breathing to relax the pelvic floor.  Baseline:  Goal status: INITIAL   LONG TERM GOALS: Target date: 07/09/24  Patient independent with advanced HEP for pelvic floor strength and coordination.  Baseline:  Goal status: INITIAL  2.  Patient is able to perform weight lifting with correct pressure management and pelvic floor strength so she is not leaking urine.  Baseline:  Goal status: INITIAL  3.  Patient is able to delay the urgency to urinate and walk to the bathroom slowly without leaking urine.  Baseline:  Goal status:  INITIAL  4.  Patient is wearing 1 to no pads due to the reduction of urinary leakage and urgency.  Baseline:  Goal status: INITIAL  5.  Patient is able to take a 1-2 hour car ride without urinary urgency and wait to urinate at the appropriate time.  Baseline:  Goal status: INITIAL   PLAN:  PT FREQUENCY: 1x/week  PT DURATION: 6 months  PLANNED INTERVENTIONS: 97110-Therapeutic exercises, 97530- Therapeutic activity, 97112- Neuromuscular re-education, 97535- Self Care, 02859- Manual therapy, G0283- Electrical stimulation (unattended), Patient/Family education, Joint mobilization, Spinal mobilization, and Biofeedback  PLAN FOR NEXT SESSION: manual work to the pelvic floor, urge to void, diaphragmatic breathing, happy baby, pressure management, go over workout to add the abdominals and pelvic floor  Channing Pereyra, PT 01/22/24 3:59 PM'

## 2024-01-31 ENCOUNTER — Other Ambulatory Visit (HOSPITAL_COMMUNITY): Payer: Self-pay

## 2024-01-31 ENCOUNTER — Other Ambulatory Visit: Payer: Self-pay

## 2024-01-31 ENCOUNTER — Encounter: Admitting: Physical Therapy

## 2024-02-02 ENCOUNTER — Encounter: Payer: Self-pay | Admitting: Family

## 2024-02-02 DIAGNOSIS — Z7184 Encounter for health counseling related to travel: Secondary | ICD-10-CM

## 2024-02-04 ENCOUNTER — Other Ambulatory Visit: Payer: Self-pay

## 2024-02-04 ENCOUNTER — Other Ambulatory Visit (HOSPITAL_COMMUNITY): Payer: Self-pay

## 2024-02-04 ENCOUNTER — Other Ambulatory Visit (HOSPITAL_BASED_OUTPATIENT_CLINIC_OR_DEPARTMENT_OTHER): Payer: Self-pay

## 2024-02-04 MED ORDER — ATOVAQUONE-PROGUANIL HCL 250-100 MG PO TABS
1.0000 | ORAL_TABLET | Freq: Every day | ORAL | 0 refills | Status: AC
  Filled 2024-02-04 (×2): qty 30, 30d supply, fill #0

## 2024-02-04 MED ORDER — METRONIDAZOLE 500 MG PO TABS
500.0000 mg | ORAL_TABLET | Freq: Two times a day (BID) | ORAL | 0 refills | Status: AC
  Filled 2024-02-04 (×2): qty 28, 14d supply, fill #0

## 2024-02-04 MED ORDER — AZITHROMYCIN 500 MG PO TABS
500.0000 mg | ORAL_TABLET | Freq: Every day | ORAL | 0 refills | Status: AC
  Filled 2024-02-04 (×2): qty 5, 5d supply, fill #0

## 2024-02-05 ENCOUNTER — Other Ambulatory Visit: Payer: Self-pay

## 2024-03-04 ENCOUNTER — Other Ambulatory Visit (HOSPITAL_COMMUNITY): Payer: Self-pay

## 2024-03-12 ENCOUNTER — Ambulatory Visit: Payer: Self-pay | Attending: Family | Admitting: Physical Therapy

## 2024-03-12 DIAGNOSIS — R252 Cramp and spasm: Secondary | ICD-10-CM | POA: Insufficient documentation

## 2024-03-12 DIAGNOSIS — R278 Other lack of coordination: Secondary | ICD-10-CM | POA: Insufficient documentation

## 2024-03-19 ENCOUNTER — Ambulatory Visit: Payer: Self-pay | Admitting: Physical Therapy

## 2024-03-19 ENCOUNTER — Encounter: Payer: Self-pay | Admitting: Physical Therapy

## 2024-03-19 DIAGNOSIS — R278 Other lack of coordination: Secondary | ICD-10-CM

## 2024-03-19 DIAGNOSIS — R252 Cramp and spasm: Secondary | ICD-10-CM

## 2024-03-19 NOTE — Therapy (Signed)
 " OUTPATIENT PHYSICAL THERAPY FEMALE PELVIC TREATMENT   Patient Name: Sara Finley MRN: [id number] DOB:1980-04-03, 44 y.o., female Today's Date: 03/19/2024  END OF SESSION:  PT End of Session - 03/19/24 1618     Visit Number 3    Date for Recertification  07/09/24    Authorization Type [employer]    PT Start Time 1615    PT Stop Time 1655    PT Time Calculation (min) 40 min    Activity Tolerance Patient tolerated treatment well    Behavior During Therapy [facility] for tasks assessed/performed          Past Medical History:  Diagnosis Date   Abnormal Pap smear of cervix 2010   + HPV. 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   Hot flashes 06/11/2023   Vertigo 11/02/2020   Thyroid  nodule 10/19/2017   Hirsutism 09/29/2015   Vitamin D  deficiency 09/29/2015   Overweight with body mass index (BMI) of 29 to 29.9 in adult 11/07/2014   Preventative health care 05/01/2014   GERD (gastroesophageal reflux disease) 05/01/2014   Allergic rhinitis 05/01/2014   Type 2 diabetes mellitus without complications (HCC) 01/09/2014    PCP: Daryl Setter, NP  REFERRING PROVIDER: Erik Kieth BROCKS, MD   REFERRING DIAG: N32.81 (ICD-10-CM) - Overactive bladder   THERAPY DIAG:  Cramp and spasm  Other lack of coordination  Rationale for Evaluation and Treatment: Rehabilitation  ONSET DATE:  2015  SUBJECTIVE:                                                                                                                                                                                           SUBJECTIVE STATEMENT: No issues. She can delay the urge the first time if she drinks caffeine. If she does not drink caffeine without an issue. No difficulty with fully emptying her bladder.    PERTINENT HISTORY:  Medications for current  condition: none Surgeries: umbilical hernia Other: See above Sexual abuse: No  PAIN:  Are you having pain? No  PRECAUTIONS: None  RED FLAGS: None   WEIGHT BEARING RESTRICTIONS: No  FALLS:  Has patient fallen in last 6 months? No  OCCUPATION: Nurse practitioner  ACTIVITY LEVEL : cardio, weight training  PLOF: Independent  PATIENT GOALS: not have to wear a pad and reduce urgency   BOWEL MOVEMENT: Pain with bowel movement: No Type of bowel movement:Type (Bristol Stool Scale) Type 3,4, Frequency every 2 days and leans forward, and Strain yes sometimes Fully empty rectum: Yes: sometimes has the sensation to have a bowel movement Leakage: No                                                 Bowel urgency: no Pads: No Fiber supplement/laxative No  URINATION: Pain with urination: No Fully empty bladder: Yes: sometimes, if have a bowel movement and not able to fully empty the bladder                                         Post-void dribble: No Stream: Strong, splays out Urgency: Yes , if she stand up she has to rush to the bathroom Frequency:during the day 2-3 hours but if she has caffeine she will urinate every 15 minutes or when she is constipated                                                        Nocturia: No2   Leakage: Urge to void, Walking to the bathroom, Exercise, Lifting, and while she is doing her weight training and has not emptied her bladder Pads/briefs: Yes: 2-3  INTERCOURSE:  Ability to have vaginal penetration Yes   Pain with intercourse: pressure and feels like something has to move out of the way Dryness: No Climax: not always Marinoff Scale: 0/3  PREGNANCY: Vaginal deliveries 1 Tearing Yes: 2nd degree  PROLAPSE: None    OBJECTIVE:  Note: Objective measures were completed at Evaluation unless otherwise noted.  PATIENT SURVEYS:  PFIQ-7: 44 UIQ-7 43  COGNITION: Overall cognitive status: Within functional limits for tasks assessed     SENSATION: Light touch: Appears intact  FUNCTIONAL TESTS:  Single leg stance:  Rt: no deficits  Lt:no deficits Sit-up test: able to curl up without assistance of arms Squat:no deficits Bed mobility:  GAIT: Assistive device utilized: None  POSTURE: No Significant postural limitations   LUMBARAROM/PROM:full lumbar ROM   LOWER EXTREMITY ROM: Full bilateral hip ROM   LOWER EXTREMITY MMT:  MMT Right eval Left eval  Hip extension 4/5 5/5  Hip abduction 4/5 5/5   (Blank rows = not tested) PALPATION: Pelvic Alignment: ASIS are equal  Abdominal:   Diastasis: No Distortion: No  Breathing: Decreased lower rib cage expansion Scar tissue: No                External Perineal Exam: scar along the right posterior fourchette and tenderness  Internal Pelvic Floor: restrictions on the right side of the bladder and urethra sphincter, burning on the right lower levator ani  Patient confirms identification and approves PT to assess internal pelvic floor and treatment Yes All internal or external pelvic floor assessments and/or treatments are completed with proper hand hygiene and gloves hands. If needed gloves are changed with hand hygiene during patient care time.  PELVIC MMT:   MMT eval 03/19/24  Vaginal 2/5 prior to manual work then after circular with a lift 3/5 4/5  (Blank rows = not tested)        TONE: average  PROLAPSE: none  TODAY'S TREATMENT:   03/19/24 Manual: Internal pelvic floor techniques: No  emotional/communication barriers or cognitive limitation. Patient is motivated to learn. Patient understands and agrees with treatment goals and plan. PT explains patient will be examined in standing, sitting, and lying down to see how their muscles and joints work. When they are ready, they will be asked to remove their underwear so PT can examine their perineum. The patient is also given the option of providing their own chaperone as one is not provided in our facility. The patient also has the right and is explained the right to defer or refuse any part of the evaluation or treatment including the internal exam. With the patient's consent, PT will use one gloved finger to gently assess the muscles of the pelvic floor, seeing how well it contracts and relaxes and if there is muscle symmetry. After, the patient will get dressed and PT and patient will discuss exam findings and plan of care. PT and patient discuss plan of care, schedule, attendance policy and HEP activities.  Therapist gloved finger working on the right perineal area externally along the ischiocavernosus and perineal body Therapist gloved finger in the vaginal canal working on the right pelvic floor with one finger internally and other externally Neuromuscular re-education: Down training: Sit on ball to massage the pelvic floor Sit on ball performing diaphragmatic breathing, she will get more pain due to the tissue stretching Educated patient on how to massage the right perineum through her pant in sitting with the right hip ER and leaning in different angles to get a better stretch    01/22/24 Manual: Soft tissue mobilization: Circular massage to the abdomen to promote peristalic motion to the intestines Manual work to the diaphragm to improve diaphragmatic breathing Myofascial release: Tissue rolling of the abdomen to reduce fascial restrictions Fascial release around the areas of the kidney and ureters Fascial release of the  urachus ligament and around the area of the bladder Neuromuscular re-education: Core retraining: Lunges with tactile cues to engage the lower abdominals and pelvic floor keeping spinal neutral Squats with tactile cues to engage the lower abdominals and pelvic floor keeping spinal neutral Supine transverse abdominus contraction 10 x Pelvic floor contraction training: Standing pelvic floor contraction with verbal cues Self-care: Discussed with patient on how the coffee irritates her bladder and to drink more water to reduce the irritation. She uses the coffee to help with the constipation.  Educated patient on how to perform abdominal massage for constipation.  DATE: 01/10/24  EVAL Examination completed, findings reviewed, pt educated on POC, HEP, and female pelvic floor anatomy, reasoning with pelvic floor assessment internally with pt consent. Pt motivated to participate in PT and agreeable to attempt recommendations.     PATIENT EDUCATION:  01/22/24 Education details: Access Code: [email or web address] Person educated: Patient Education method: Explanation, Demonstration, Tactile cues, Verbal cues, and Handouts Education comprehension: verbalized understanding, returned demonstration, verbal cues required, tactile cues required, and needs further education  HOME EXERCISE PROGRAM: 11/ Access Code: [email or web address] URL: [email or web address] Date: 01/22/2024 Prepared by: Channing Pereyra  Exercises - Standard Lunge  - 1 x daily - 7 x weekly - 3 sets - 10 reps - Squat  - 1 x daily - 7 x weekly - 3 sets - 10 reps - Seated Pelvic Floor Contraction  - 3 x daily - 7 x weekly - 1 sets - 10 reps - 5 sec hold  Patient Education - Abdominal Massage for Constipation    ASSESSMENT:  CLINICAL IMPRESSION: Patient is a 44 y.o. female who was seen today for physical therapy   treatment for Overactive bladder. Patient as issues wit urgency when she drinks caffiene. Her pelvic floor strength is 4/5. She had trigger points in the right pelvic floor that causes burning. She was able to sit on the ball without burning at end of treatment.   Patient is able to perform weightlifting without leaking urine. Patient will benefit from skilled therapy to reduce restrictions of the tissue and improve the coordination to reduce urinary leakage and urgency.   OBJECTIVE IMPAIRMENTS: decreased coordination, decreased strength, increased fascial restrictions, and increased muscle spasms.   ACTIVITY LIMITATIONS: lifting, continence, and weightlifting  PARTICIPATION LIMITATIONS: driving, shopping, and community activity  PERSONAL FACTORS: Time since onset of injury/illness/exacerbation are also affecting patient's functional outcome.   REHAB POTENTIAL: Excellent  CLINICAL DECISION MAKING: Stable/uncomplicated  EVALUATION COMPLEXITY: Low   GOALS: Goals reviewed with patient? Yes  SHORT TERM GOALS: Target date: 02/07/24  Patient educated on how to perform manual work to the pelvic floor to reduce restrictions.  Baseline: Goal status: Met 03/19/24  2.  Patient educated on diaphragmatic breathing to relax the pelvic floor.  Baseline:  Goal status: Met 03/19/24  LONG TERM GOALS: Target date: 07/09/24  Patient independent with advanced HEP for pelvic floor strength and coordination.  Baseline:  Goal status: INITIAL  2.  Patient is able to perform weight lifting with correct pressure management and pelvic floor strength so she is not leaking urine.  Baseline:  Goal status: Met 03/19/24  3.  Patient is able to delay the urgency to urinate and walk to the bathroom slowly without leaking urine.  Baseline:  Goal status: INITIAL  4.  Patient is wearing 1 to no pads due to the reduction of urinary leakage and urgency.  Baseline:  Goal status: INITIAL  5.  Patient is able to  take a 1-2 hour car ride without urinary urgency and wait to urinate at the appropriate time.  Baseline:  Goal status: INITIAL   PLAN:  PT FREQUENCY: 1x/week  PT DURATION: 6 months  PLANNED INTERVENTIONS: 97110-Therapeutic exercises, 97530- Therapeutic activity, 97112- Neuromuscular re-education, 97535- Self Care, 02859- Manual therapy, G0283- Electrical stimulation (unattended), Patient/Family education, Joint mobilization, Spinal mobilization, and Biofeedback  PLAN FOR NEXT SESSION: manual work to the pelvic floor,  pressure management, go over workout to add the abdominals and pelvic floor if doing well then discharge  Channing Pereyra, PT 03/19/24 5:02 PM' "

## 2024-03-24 ENCOUNTER — Ambulatory Visit: Admitting: Physical Therapy

## 2024-03-31 ENCOUNTER — Ambulatory Visit: Payer: Self-pay | Admitting: Physical Therapy

## 2024-04-05 ENCOUNTER — Ambulatory Visit: Payer: Self-pay | Attending: Family | Admitting: Physical Therapy

## 2024-04-21 ENCOUNTER — Ambulatory Visit: Admitting: Physical Therapy

## 2024-04-21 ENCOUNTER — Ambulatory Visit: Payer: Self-pay | Admitting: Physical Therapy
